# Patient Record
Sex: Female | Born: 1985 | Hispanic: Yes | Marital: Single | Smoking: Never smoker
Health system: Southern US, Community
[De-identification: ages and names within clinical notes are randomized; demographics above are authoritative.]

## PROBLEM LIST (undated history)

## (undated) DIAGNOSIS — R87629 Unspecified abnormal cytological findings in specimens from vagina: Secondary | ICD-10-CM

## (undated) DIAGNOSIS — Z8669 Personal history of other diseases of the nervous system and sense organs: Secondary | ICD-10-CM

## (undated) DIAGNOSIS — J45909 Unspecified asthma, uncomplicated: Secondary | ICD-10-CM

## (undated) DIAGNOSIS — F99 Mental disorder, not otherwise specified: Secondary | ICD-10-CM

## (undated) HISTORY — DX: Unspecified asthma, uncomplicated: J45.909

## (undated) HISTORY — PX: OTHER SURGICAL HISTORY: SHX169

## (undated) HISTORY — DX: Mental disorder, not otherwise specified: F99

## (undated) HISTORY — DX: Unspecified abnormal cytological findings in specimens from vagina: R87.629

---
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
## 2012-10-11 DIAGNOSIS — N871 Moderate cervical dysplasia: Secondary | ICD-10-CM | POA: Insufficient documentation

## 2012-10-17 ENCOUNTER — Emergency Department: Payer: Self-pay | Admitting: Emergency Medicine

## 2013-12-17 ENCOUNTER — Emergency Department: Payer: Self-pay | Admitting: Internal Medicine

## 2017-03-20 ENCOUNTER — Other Ambulatory Visit: Payer: Self-pay

## 2017-03-20 ENCOUNTER — Emergency Department
Admission: EM | Admit: 2017-03-20 | Discharge: 2017-03-21 | Disposition: A | Discharge status: Home / Self Care | Payer: Medicare Other | Attending: Emergency Medicine | Admitting: Emergency Medicine

## 2017-03-20 ENCOUNTER — Encounter: Payer: Self-pay | Admitting: *Deleted

## 2017-03-20 DIAGNOSIS — J039 Acute tonsillitis, unspecified: Secondary | ICD-10-CM | POA: Diagnosis not present

## 2017-03-20 DIAGNOSIS — J029 Acute pharyngitis, unspecified: Secondary | ICD-10-CM

## 2017-03-20 LAB — CBC WITH DIFFERENTIAL/PLATELET
BASOS ABS: 0 10*3/uL (ref 0–0.1)
BASOS PCT: 0 %
EOS ABS: 0.2 10*3/uL (ref 0–0.7)
EOS PCT: 2 %
HEMATOCRIT: 38.8 % (ref 35.0–47.0)
Hemoglobin: 13.1 g/dL (ref 12.0–16.0)
Lymphocytes Relative: 27 %
Lymphs Abs: 2.3 10*3/uL (ref 1.0–3.6)
MCH: 29.6 pg (ref 26.0–34.0)
MCHC: 33.7 g/dL (ref 32.0–36.0)
MCV: 87.9 fL (ref 80.0–100.0)
MONO ABS: 0.6 10*3/uL (ref 0.2–0.9)
Monocytes Relative: 7 %
Neutro Abs: 5.3 10*3/uL (ref 1.4–6.5)
Neutrophils Relative %: 64 %
PLATELETS: 369 10*3/uL (ref 150–440)
RBC: 4.42 MIL/uL (ref 3.80–5.20)
RDW: 12.9 % (ref 11.5–14.5)
WBC: 8.4 10*3/uL (ref 3.6–11.0)

## 2017-03-20 MED ORDER — DEXTROSE 5 % IV SOLN
1.0000 g | Freq: Once | INTRAVENOUS | Status: AC
  Administered 2017-03-20: 1 g via INTRAVENOUS
  Filled 2017-03-20: qty 10

## 2017-03-20 MED ORDER — DEXAMETHASONE SODIUM PHOSPHATE 10 MG/ML IJ SOLN
10.0000 mg | Freq: Once | INTRAMUSCULAR | Status: AC
  Administered 2017-03-20: 10 mg via INTRAVENOUS
  Filled 2017-03-20: qty 1

## 2017-03-20 NOTE — ED Triage Notes (Signed)
Pt reports three days of throat pain and feeling "like there is a mass in the back of my throat" Pt denies SOB or difficulty breathing but pt has not been able to swallow food without increased pain. Pts tonsils are notably swollen and per PCP left tonsil looked different than right. This RN does not notice a big difference between the two.   No fevers reported.

## 2017-03-20 NOTE — ED Notes (Signed)
Pt also requesting a pregnancy test. Pt did not have a normal period last month and is unsure if she is currently pregnant.

## 2017-03-21 ENCOUNTER — Encounter: Payer: Self-pay | Admitting: Radiology

## 2017-03-21 ENCOUNTER — Emergency Department: Payer: Medicare Other

## 2017-03-21 DIAGNOSIS — J029 Acute pharyngitis, unspecified: Secondary | ICD-10-CM | POA: Diagnosis not present

## 2017-03-21 LAB — COMPREHENSIVE METABOLIC PANEL
ALBUMIN: 4 g/dL (ref 3.5–5.0)
ALT: 16 U/L (ref 14–54)
ANION GAP: 12 (ref 5–15)
AST: 24 U/L (ref 15–41)
Alkaline Phosphatase: 88 U/L (ref 38–126)
BUN: 11 mg/dL (ref 6–20)
CHLORIDE: 102 mmol/L (ref 101–111)
CO2: 23 mmol/L (ref 22–32)
Calcium: 9.3 mg/dL (ref 8.9–10.3)
Creatinine, Ser: 0.76 mg/dL (ref 0.44–1.00)
GFR calc Af Amer: >60 mL/min (ref >60)
GFR calc non Af Amer: >60 mL/min (ref >60)
GLUCOSE: 95 mg/dL (ref 65–99)
Potassium: 3.5 mmol/L (ref 3.5–5.1)
SODIUM: 137 mmol/L (ref 135–145)
Total Bilirubin: 0.4 mg/dL (ref 0.3–1.2)
Total Protein: 8.1 g/dL (ref 6.5–8.1)

## 2017-03-21 LAB — POCT PREGNANCY, URINE: PREG TEST UR: NEGATIVE

## 2017-03-21 MED ORDER — PREDNISONE 20 MG PO TABS: 60.0000 mg | ORAL_TABLET | Freq: Every day | ORAL | 0 refills | Status: AC

## 2017-03-21 MED ORDER — AMOXICILLIN-POT CLAVULANATE 875-125 MG PO TABS: 1.0000 | ORAL_TABLET | Freq: Two times a day (BID) | ORAL | 0 refills | Status: AC

## 2017-03-21 MED ORDER — IOPAMIDOL (ISOVUE-300) INJECTION 61%
75.0000 mL | Freq: Once | INTRAVENOUS | Status: AC | PRN
  Administered 2017-03-21: 75 mL via INTRAVENOUS

## 2017-03-21 NOTE — ED Provider Notes (Signed)
[facility] Emergency Department Provider Note   First MD Initiated Contact with Patient 03/20/17 2310     (approximate)  I have reviewed the triage vital signs and the nursing notes.   HISTORY  Chief Complaint Sore Throat    HPI Jacqueline Osborn is a 32 y.o. female presents to the emergency department with 3-day history of sore throat and sensation of a mass in the back of her throat".  Patient states that she gets tonsillitis quite frequently sometimes twice or more per year.  Patient states that she was seen by her primary care provider who stated that her tonsils were swollen however no treatment was administered.  Patient denies any fever afebrile on presentation   Past medical history Tonsillitis There are no active problems to display for this patient.   Past surgical history None  Prior to Admission medications   Medication Sig Start Date End Date Taking? Authorizing Provider  amoxicillin-clavulanate (AUGMENTIN) 875-125 MG tablet Take 1 tablet by mouth 2 (two) times daily for 10 days. 03/21/17 03/31/17  Darci CurrentBrown, [place] N, MD  predniSONE (DELTASONE) 20 MG tablet Take 3 tablets (60 mg total) by mouth daily for 5 days. 03/21/17 03/26/17  Darci CurrentBrown, [place] N, MD    Allergies No known drug allergies History reviewed. No pertinent family history.  Social History Social History   Tobacco Use  . Smoking status: Never Smoker  . Smokeless tobacco: Never Used  Substance Use Topics  . Alcohol use: No    Frequency: Never  . Drug use: No    Review of Systems Constitutional: No fever/chills Eyes: No visual changes. ENT: Positive for sore throat. Cardiovascular: Denies chest pain. Respiratory: Denies shortness of breath. Gastrointestinal: No abdominal pain.  No nausea, no vomiting.  No diarrhea.  No constipation. Genitourinary: Negative for dysuria. Musculoskeletal: Negative for neck pain.  Negative for back pain. Integumentary: Negative for  rash. Neurological: Negative for headaches, focal weakness or numbness.   ____________________________________________   PHYSICAL EXAM:  VITAL SIGNS: ED Triage Vitals [03/20/17 2208]  Enc Vitals Group     BP (!) 149/88     Pulse Rate 91     Resp 16     Temp (!) 97.4 F (36.3 C)     Temp Source Oral     SpO2 98 %     Weight 116.4 kg (256 lb 9.9 oz)     Height      Head Circumference      Peak Flow      Pain Score 10     Pain Loc      Pain Edu?      Excl. in GC?     Constitutional: Alert and oriented. Well appearing and in no acute distress. Eyes: Conjunctivae are normal.  Head: Atraumatic. Ears:  Healthy appearing ear canals and TMs bilaterally Nose: No congestion/rhinnorhea. Mouth/Throat: Mucous membranes are moist.  Gradual erythema with enlarged tonsils right greater than left  neck: No stridor.  Palpable anterior cervical lymphadenopathy cardiovascular: Normal rate, regular rhythm. Good peripheral circulation. Grossly normal heart sounds. Respiratory: Normal respiratory effort.  No retractions. Lungs CTAB. Gastrointestinal: Soft and nontender. No distention.  Musculoskeletal: No lower extremity tenderness nor edema. No gross deformities of extremities. Neurologic:  Normal speech and language. No gross focal neurologic deficits are appreciated.  Skin:  Skin is warm, dry and intact. No rash noted. Psychiatric: Mood and affect are normal. Speech and behavior are normal.  ____________________________________________   LABS (all labs ordered are  listed, but only abnormal results are displayed)  Labs Reviewed  CBC WITH DIFFERENTIAL/PLATELET  COMPREHENSIVE METABOLIC PANEL  POC URINE PREG, ED  POCT PREGNANCY, URINE     Procedures   ____________________________________________   INITIAL IMPRESSION / ASSESSMENT AND PLAN / ED COURSE  As part of my medical decision making, I reviewed the following data within the electronic MEDICAL RECORD [id number] year old female  presenting with above-stated history and physical exam with suspicion for pharyngitis versus tonsillitis versus tonsillar abscess.  CT scan of the neck revealed pharyngitis/tonsillitis.  Patient given IV ceftriaxone 1 g and Solu-Medrol on 25 mg in the emergency department.  Patient will be prescribed Augmentin and prednisone for home with referral to ENT ____________________________________________  FINAL CLINICAL IMPRESSION(S) / ED DIAGNOSES  Final diagnoses:  Acute pharyngitis, unspecified etiology  Tonsillitis     MEDICATIONS GIVEN DURING THIS VISIT:  Medications  dexamethasone (DECADRON) injection 10 mg (10 mg Intravenous Given 03/20/17 2340)  cefTRIAXone (ROCEPHIN) 1 g in dextrose 5 % 50 mL IVPB (0 g Intravenous Stopped 03/21/17 0020)  iopamidol (ISOVUE-300) 61 % injection 75 mL (75 mLs Intravenous Contrast Given 03/21/17 0025)     ED Discharge Orders        Ordered    amoxicillin-clavulanate (AUGMENTIN) 875-125 MG tablet  2 times daily     03/21/17 0102    predniSONE (DELTASONE) 20 MG tablet  Daily     03/21/17 0102       Note:  This document was prepared using Dragon voice recognition software and may include unintentional dictation errors.    Darci Current, MD 03/21/17 [id number]

## 2018-02-01 DIAGNOSIS — Z8669 Personal history of other diseases of the nervous system and sense organs: Secondary | ICD-10-CM

## 2018-02-01 HISTORY — DX: Personal history of other diseases of the nervous system and sense organs: Z86.69

## 2018-02-21 ENCOUNTER — Encounter: Payer: Self-pay | Admitting: Family Medicine

## 2018-02-21 DIAGNOSIS — T7491XA Unspecified adult maltreatment, confirmed, initial encounter: Secondary | ICD-10-CM | POA: Insufficient documentation

## 2018-07-31 DIAGNOSIS — J45909 Unspecified asthma, uncomplicated: Secondary | ICD-10-CM

## 2018-07-31 DIAGNOSIS — Z915 Personal history of self-harm: Secondary | ICD-10-CM | POA: Insufficient documentation

## 2018-07-31 DIAGNOSIS — O0990 Supervision of high risk pregnancy, unspecified, unspecified trimester: Secondary | ICD-10-CM | POA: Insufficient documentation

## 2018-07-31 DIAGNOSIS — O99210 Obesity complicating pregnancy, unspecified trimester: Secondary | ICD-10-CM | POA: Insufficient documentation

## 2018-07-31 DIAGNOSIS — Z9151 Personal history of suicidal behavior: Secondary | ICD-10-CM | POA: Insufficient documentation

## 2018-07-31 DIAGNOSIS — I10 Essential (primary) hypertension: Secondary | ICD-10-CM | POA: Insufficient documentation

## 2018-07-31 DIAGNOSIS — F1210 Cannabis abuse, uncomplicated: Secondary | ICD-10-CM | POA: Insufficient documentation

## 2018-07-31 DIAGNOSIS — O99810 Abnormal glucose complicating pregnancy: Secondary | ICD-10-CM

## 2018-07-31 DIAGNOSIS — Z6740 Type O blood, Rh positive: Secondary | ICD-10-CM

## 2018-07-31 DIAGNOSIS — F331 Major depressive disorder, recurrent, moderate: Secondary | ICD-10-CM | POA: Insufficient documentation

## 2018-08-07 ENCOUNTER — Other Ambulatory Visit: Payer: Self-pay

## 2018-08-07 ENCOUNTER — Ambulatory Visit: Payer: Medicare Other | Admitting: Physician Assistant

## 2018-08-07 VITALS — BP 122/77 | Temp 98.1°F | Wt 285.4 lb

## 2018-08-07 DIAGNOSIS — O163 Unspecified maternal hypertension, third trimester: Secondary | ICD-10-CM

## 2018-08-07 DIAGNOSIS — O0993 Supervision of high risk pregnancy, unspecified, third trimester: Secondary | ICD-10-CM | POA: Diagnosis not present

## 2018-08-07 DIAGNOSIS — Z9151 Personal history of suicidal behavior: Secondary | ICD-10-CM

## 2018-08-07 DIAGNOSIS — O99213 Obesity complicating pregnancy, third trimester: Secondary | ICD-10-CM | POA: Diagnosis not present

## 2018-08-07 DIAGNOSIS — F1210 Cannabis abuse, uncomplicated: Secondary | ICD-10-CM

## 2018-08-07 DIAGNOSIS — I10 Essential (primary) hypertension: Secondary | ICD-10-CM

## 2018-08-07 DIAGNOSIS — Z6740 Type O blood, Rh positive: Secondary | ICD-10-CM

## 2018-08-07 DIAGNOSIS — T7491XD Unspecified adult maltreatment, confirmed, subsequent encounter: Secondary | ICD-10-CM

## 2018-08-07 DIAGNOSIS — Z915 Personal history of self-harm: Secondary | ICD-10-CM

## 2018-08-07 DIAGNOSIS — F331 Major depressive disorder, recurrent, moderate: Secondary | ICD-10-CM

## 2018-08-07 DIAGNOSIS — N871 Moderate cervical dysplasia: Secondary | ICD-10-CM

## 2018-08-07 NOTE — Progress Notes (Signed)
In for visit; aware of [facility] IOL 08/10/18 and NST today.

## 2018-08-10 DIAGNOSIS — O0990 Supervision of high risk pregnancy, unspecified, unspecified trimester: Secondary | ICD-10-CM | POA: Diagnosis not present

## 2018-08-10 DIAGNOSIS — O169 Unspecified maternal hypertension, unspecified trimester: Secondary | ICD-10-CM | POA: Insufficient documentation
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
## 2018-08-14 ENCOUNTER — Telehealth: Payer: Self-pay | Admitting: Licensed Clinical Social Worker

## 2018-08-14 NOTE — Telephone Encounter (Signed)
LCSW returned call and spoke with Mr. Jacqueline Osborn. LCSW confirmed that Mr. Caceres need the information for the purposes of patient's treatment. He voiced that patient saw the doctor today and wanted to confirm her mental health treatment. LCSW informed Mr. Caceres that LCSW was not currently providing services to this patient.

## 2018-08-14 NOTE — Telephone Encounter (Signed)
Mr. Vanetta Shawl, Behavior Health Clinician from [facility], left vm for LCSW requesting confirmation of services being provided to patient by LCSW for the purposes of treatment.

## 2018-08-17 ENCOUNTER — Telehealth: Payer: Self-pay | Admitting: Licensed Clinical Social Worker

## 2018-08-17 NOTE — Telephone Encounter (Signed)
Called to conduct 2 week postpartum mood check. LCSW spoke with patient and provided brief psycoeduction on postpartum mood and anxiety disorders. LCSW assessed patient's mood and anxiety symptoms. Patient denies any current symptoms, she reports compliance with meds and reports benefit. Patient shares that her mom is helping her with baby so she can get some sleep, and she reports everything is good. Patient reports that she followed up with [name] regarding her blood pressure and plans to come to [facility] for postpartum exam. LCSW encouraged patient to seek support if needed.

## 2018-08-20 ENCOUNTER — Encounter: Payer: Self-pay | Admitting: Family Medicine

## 2018-08-22 ENCOUNTER — Ambulatory Visit: Payer: Medicare Other

## 2018-09-22 ENCOUNTER — Ambulatory Visit: Payer: Medicare Other

## 2018-09-26 ENCOUNTER — Telehealth: Payer: Self-pay | Admitting: Family Medicine

## 2018-11-02 ENCOUNTER — Ambulatory Visit: Payer: Self-pay

## 2018-11-02 ENCOUNTER — Ambulatory Visit: Payer: Medicare Other | Admitting: Advanced Practice Midwife

## 2018-11-02 ENCOUNTER — Other Ambulatory Visit: Payer: Self-pay

## 2018-11-02 VITALS — BP 126/86 | Wt 254.2 lb

## 2018-11-02 DIAGNOSIS — Z3009 Encounter for other general counseling and advice on contraception: Secondary | ICD-10-CM

## 2018-11-02 LAB — HEMOGLOBIN, FINGERSTICK: Hemoglobin: 12.5 g/dL (ref 11.1–15.9)

## 2018-11-02 NOTE — Progress Notes (Signed)
Post Partum Exam  Jacqueline Osborn is a 33 y.o. G36P3003 female who presents for a postpartum visit. She is 12 weeks postpartum following a spontaneous vaginal delivery. I have fully reviewed the prenatal and intrapartum course. The delivery was at 24 1/7 gestational weeks, F 9# at 49 1/7 on 08/11/18.  Anesthesia: nitrous oxide. Postpartum course has been wnl except walked out AMA after birth declining BP monitoring after initiation of Procardia XL 30 mg daily; BP's immediately pp were 144/105.  States she went to [name] on 08/14/18 and they increased her dose to BID and she never went to f/u appt there and ran out in July.. Baby's course has been wnl. Baby is feeding by bottle - formula q 3-4 hrs (4 oz) and weighed 11# on 10/12/18.Marland Kitchen Bleeding no bleeding. Bowel function is normal. Bladder function is normal. Patient is sexually active. Contraception method is none. Pt states declines Zoloft that took during this pregnancy and states mood is "fine".  Last sex yesterday without condom, time before was mid 09/2018 without condom.  Denies partner abuse.  Lives with FOB and 3 children.  Her 59 yo daughter helps her.  Postpartum depression screening: Edinburgh Postnatal Depression Scale - 11/02/18 1426      Edinburgh Postnatal Depression Scale:  In the Past 7 Days   I have been able to laugh and see the funny side of things.  0    I have looked forward with enjoyment to things.  0    I have blamed myself unnecessarily when things went wrong.  1    I have been anxious or worried for no good reason.  0    I have felt scared or panicky for no good reason.  0    Things have been getting on top of me.  0    I have been so unhappy that I have had difficulty sleeping.  1    I have felt sad or miserable.  0    I have been so unhappy that I have been crying.  0    The thought of harming myself has occurred to me.  0    Edinburgh Postnatal Depression Scale Total  2         Last pap smear done 02/2018? and was  Abnormal- neg with +HPV  Review of Systems Pertinent items are noted in HPI.    Objective:  BP 126/86   Wt 254 lb 3.2 oz (115.3 kg)   LMP 10/19/2017 (Exact Date) Comment: normal  Breastfeeding No   BMI 42.30 kg/m   General:  alert   Breasts:  negative  Lungs: clear to auscultation bilaterally  Heart:  regular rate and rhythm, S1, S2 normal, no murmur, click, rub or gallop  Abdomen: soft, non-tender; bowel sounds normal; no masses,  no organomegaly   Vulva:  normal  Vagina: normal vagina  Cervix:  multiparous appearance  Corpus: normal size, contour, position, consistency, mobility, non-tender  Adnexa:  normal adnexa  Rectal Exam: Normal rectovaginal exam        Assessment:    12 wk postpartum exam. Pap smear needed 02/2019  Plan:   1. Contraception: abstinence 2. Infant feeding:  patient is currently feeding with formula.  If breastmilk feeding patient was given letter for employer to provide appropriate pumping time to express breastmilk.  3. Mood: EPDS is low risk. Reviewed resources and that mood sx in first year after pregnancy are considered related to pregnancy and to reach out for help  at [facility] if needed. Discussed [facility] as link to care and availability of LCSW for counseling  4. Chronic Medical Conditions:  HTN list chronic medical problems and follow up/management plan.   Patient given handout about PCP care in the community Given MVI per family planning program  Follow up in: 2 weeks for Nexplanon insertion.  Needs PT before insertion and pt declines birth control and wants abstinance from now until then

## 2018-11-06 NOTE — Progress Notes (Addendum)
Hgb reviewed, no tx per standing order. Nexplanon insertion scheduled for 11/16/2018.  Abstinence consent signed. Provider orders completed.

## 2018-11-16 ENCOUNTER — Ambulatory Visit: Payer: Self-pay

## 2018-12-27 ENCOUNTER — Encounter: Payer: Self-pay | Admitting: Family Medicine

## 2018-12-27 ENCOUNTER — Other Ambulatory Visit: Payer: Self-pay

## 2018-12-27 ENCOUNTER — Ambulatory Visit (LOCAL_COMMUNITY_HEALTH_CENTER): Payer: Medicare Other | Admitting: Family Medicine

## 2018-12-27 VITALS — BP 125/87 | Ht 66.0 in | Wt 250.0 lb

## 2018-12-27 DIAGNOSIS — Z30017 Encounter for initial prescription of implantable subdermal contraceptive: Secondary | ICD-10-CM

## 2018-12-27 DIAGNOSIS — Z3009 Encounter for other general counseling and advice on contraception: Secondary | ICD-10-CM | POA: Diagnosis not present

## 2018-12-27 MED ORDER — ETONOGESTREL 68 MG ~~LOC~~ IMPL
68.0000 mg | DRUG_IMPLANT | Freq: Once | SUBCUTANEOUS | Status: AC
  Administered 2018-12-27: 16:00:00 68 mg via SUBCUTANEOUS

## 2018-12-27 NOTE — Progress Notes (Addendum)
Pt here for Nexplanon insertion. Pt with recent abortion on 12/12/2018 and reports last sex was ~11/16/2018. RN counseling for Nexplanon insertion completed and consent forms reviewed and signed by pt.Ronny Bacon, RN

## 2018-12-27 NOTE — Progress Notes (Signed)
Nexplanon Insertion Procedure Patient identified, informed consent performed, consent signed.   Patient does understand that irregular bleeding is a very common side effect of this medication. She was advised to have backup contraception after placement. Patient was determined to meet WHO criteria for not being pregnant. Appropriate time out taken.  The insertion site was identified 8-10 cm (3-4 inches) from the medial epicondyle of the humerus and 3-5 cm (1.25-2 inches) posterior to (below) the sulcus (groove) between the biceps and triceps muscles of the patient's left arm and marked.  Patient was prepped with alcohol swab and then injected with 3 ml of 1% lidocaine.  Arm was prepped with chlorhexidene, Nexplanon removed from packaging,  Device confirmed in needle, then inserted full length of needle and withdrawn per handbook instructions. Nexplanon was able to palpated in the patient's arm; patient palpated the insert herself. There was minimal blood loss.  Patient insertion site covered with guaze and a pressure bandage to reduce any bruising.  The patient tolerated the procedure well and was given post procedure instructions.  Nexplanon:   Counseled patient to take OTC analgesic starting as soon as lidocaine starts to wear off and take regularly for at least 48 hr to decrease discomfort.  Specifically to take with food or milk to decrease stomach upset and for IB 600 mg (3 tablets) every 6 hrs; IB 800 mg (4 tablets) every 8 hrs; or Aleve 2 tablets every 12 hrs.   

## 2019-02-28 ENCOUNTER — Telehealth: Payer: Self-pay | Admitting: Family Medicine

## 2019-02-28 NOTE — Telephone Encounter (Signed)
TC to patient who states she had nexplanon inserted 10/2018 at [facility] appointment. She states she had normal period last month and long period this month. Patient states her period this month has been almost 2 weeks, last few days spotting. Patient counseled that irregular periods are common for 3-6 months following nexplanon insertion. Patient states she was told to call with any issues and wanted to let us know about longer than normal period. She declines need for provider consult at this time, and was counseled that if her period continues and gets very heavy again this month to call back for phone consult. Patient agrees with plan.Burt Knack, RN

## 2019-02-28 NOTE — Telephone Encounter (Signed)
Patient would like to speak to nurse about her nexplanon

## 2019-05-01 ENCOUNTER — Ambulatory Visit: Payer: Self-pay

## 2019-05-08 ENCOUNTER — Ambulatory Visit: Payer: Medicare Other

## 2019-05-29 NOTE — Telephone Encounter (Signed)
Appt scheduled

## 2019-11-12 ENCOUNTER — Ambulatory Visit: Payer: Self-pay | Admitting: Physician Assistant

## 2019-11-12 ENCOUNTER — Other Ambulatory Visit: Payer: Self-pay

## 2019-11-12 DIAGNOSIS — Z113 Encounter for screening for infections with a predominantly sexual mode of transmission: Secondary | ICD-10-CM

## 2019-11-12 LAB — WET PREP FOR TRICH, YEAST, CLUE
Trichomonas Exam: NEGATIVE
Yeast Exam: NEGATIVE

## 2019-11-13 ENCOUNTER — Encounter: Payer: Self-pay | Admitting: Physician Assistant
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
## 2019-11-17 LAB — GONOCOCCUS CULTURE

## 2019-11-20 ENCOUNTER — Telehealth: Payer: Self-pay | Admitting: Family Medicine

## 2019-11-20 NOTE — Telephone Encounter (Signed)
Call to patient to discuss positive TR.  Patient verified by password.  Patient informed of + CT result.  Patient schedueld for treatment appointment.  Patient verbalizes understanding at this time and has no further questions.  Wendi Snipes, RN

## 2019-11-21 ENCOUNTER — Other Ambulatory Visit: Payer: Self-pay

## 2019-11-21 ENCOUNTER — Ambulatory Visit: Payer: Self-pay

## 2019-11-21 DIAGNOSIS — A749 Chlamydial infection, unspecified: Secondary | ICD-10-CM

## 2019-11-21 MED ORDER — AZITHROMYCIN 500 MG PO TABS
1000.0000 mg | ORAL_TABLET | Freq: Once | ORAL | Status: AC
  Administered 2019-11-21: 1000 mg via ORAL

## 2019-11-21 NOTE — Progress Notes (Signed)
Treated for Chlamydia today with Azithromycin per Standing Order Dr. Alvester Morin. Reports eating before appt. Instructions given to call for appt for re treatment if vomits within 2 hrs. Pt's questions answered and states understanding. Jerel Shepherd, RN

## 2020-04-02 ENCOUNTER — Ambulatory Visit (LOCAL_COMMUNITY_HEALTH_CENTER): Payer: Medicare Other | Admitting: Physician Assistant

## 2020-04-02 ENCOUNTER — Other Ambulatory Visit: Payer: Self-pay

## 2020-04-02 ENCOUNTER — Encounter: Payer: Self-pay | Admitting: Physician Assistant

## 2020-04-02 VITALS — BP 142/93 | Ht 65.0 in | Wt 286.8 lb

## 2020-04-02 DIAGNOSIS — B3731 Acute candidiasis of vulva and vagina: Secondary | ICD-10-CM

## 2020-04-02 DIAGNOSIS — Z01419 Encounter for gynecological examination (general) (routine) without abnormal findings: Secondary | ICD-10-CM | POA: Diagnosis not present

## 2020-04-02 DIAGNOSIS — Z113 Encounter for screening for infections with a predominantly sexual mode of transmission: Secondary | ICD-10-CM

## 2020-04-02 DIAGNOSIS — B373 Candidiasis of vulva and vagina: Secondary | ICD-10-CM

## 2020-04-02 DIAGNOSIS — Z3009 Encounter for other general counseling and advice on contraception: Secondary | ICD-10-CM | POA: Diagnosis not present

## 2020-04-02 DIAGNOSIS — Z3046 Encounter for surveillance of implantable subdermal contraceptive: Secondary | ICD-10-CM | POA: Diagnosis not present

## 2020-04-02 MED ORDER — CLOTRIMAZOLE 1 % VA CREA: 1.0000 | TOPICAL_CREAM | Freq: Every day | VAGINAL | 0 refills | Status: AC

## 2020-04-02 NOTE — Progress Notes (Unsigned)
Pt to clinic for physical and STD screening. Pt is happy with Nexplanon, placed by [facility] 12/27/2018. Pt denies headache, blurred vision, dizziness and stated she knew it would be high after running after daughter (in the room).

## 2020-04-02 NOTE — Progress Notes (Signed)
TR pending. Condoms given. Dispensed vaginal yeast cream per provider verbal order. BP recheck of 142/93 shown to provider, pt was still trying to take care of child and talking while BP was being taken. Provider orders completed.

## 2020-04-04 ENCOUNTER — Encounter: Payer: Self-pay | Admitting: Physician Assistant
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
## 2020-04-07 LAB — IGP, APTIMA HPV
HPV Aptima: NEGATIVE
PAP Smear Comment: 0

## 2020-04-08 LAB — HM HIV SCREENING LAB: HM HIV Screening: NEGATIVE

## 2020-04-08 LAB — HM HEPATITIS C SCREENING LAB: HM Hepatitis Screen: NEGATIVE

## 2020-06-26 ENCOUNTER — Ambulatory Visit: Payer: Medicare Other

## 2020-07-14 ENCOUNTER — Other Ambulatory Visit: Payer: Self-pay

## 2020-07-14 ENCOUNTER — Ambulatory Visit (LOCAL_COMMUNITY_HEALTH_CENTER): Payer: Medicare Other | Admitting: Family Medicine

## 2020-07-14 VITALS — BP 133/98 | Ht 65.0 in | Wt 276.6 lb

## 2020-07-14 DIAGNOSIS — Z30011 Encounter for initial prescription of contraceptive pills: Secondary | ICD-10-CM | POA: Diagnosis not present

## 2020-07-14 DIAGNOSIS — Z3046 Encounter for surveillance of implantable subdermal contraceptive: Secondary | ICD-10-CM | POA: Diagnosis not present

## 2020-07-14 DIAGNOSIS — Z3009 Encounter for other general counseling and advice on contraception: Secondary | ICD-10-CM

## 2020-07-14 MED ORDER — NORGESTIM-ETH ESTRAD TRIPHASIC 0.18/0.215/0.25 MG-25 MCG PO TABS: 1.0000 | ORAL_TABLET | Freq: Every day | ORAL | 8 refills | Status: DC

## 2020-07-14 NOTE — Progress Notes (Signed)
   [facility]  Subjective:  Jacqueline Osborn is a 35 y.o. being seen today for   Chief Complaint  Patient presents with  . Contraception    Removal of Nexplanon    HPI   Does the patient have a current or past history of drug use? No   No components found for: HCV]   Health Maintenance Due  Topic Date Due  . COVID-19 Vaccine (1) Never done  . Pneumococcal Vaccine 41-65 Years old (1 of 2 - PPSV23) Never done    ROS  The following portions of the patient's history were reviewed and updated as appropriate: allergies, current medications, past family history, past medical history, past social history, past surgical history and problem list. Problem list updated.   See flowsheet for other program required questions.  Objective:   Vitals:   07/14/20 1521  BP: (!) 133/98  Weight: 276 lb 9.6 oz (125.5 kg)  Height: 5\' 5"  (1.651 m)    Physical Exam    Assessment and Plan:  Jacqueline Osborn is a 35 y.o. female presenting to the [facility] Department for a Women's Health problem visit  1. Nexplanon removal Patient identified, informed consent performed, consent signed.   Appropriate time out taken. Nexplanon site identified.  Area prepped in usual sterile fashon. 3 ml of 1% lidocaine with Epinephrine was used to anesthetize the area at the distal end of the implant and along implant site. A small stab incision was made right beside the implant on the distal portion.  The Nexplanon rod was grasped using hemostats and removed without difficulty.  There was minimal blood loss. There were no complications.  Steri-strips were applied over the small incision.  A pressure bandage was applied to reduce any bruising.  The patient tolerated the procedure well and was given post procedure instructions.    Counseled patient to take OTC analgesic starting as soon as lidocaine starts to wear off and take regularly  for at least 48 hr to decrease discomfort.  Specifically to take with food or milk to decrease stomach upset and for IB 600 mg (3 tablets) every 6 hrs; IB 800 mg (4 tablets) every 8 hrs; or Aleve 2 tablets every 12 hrs.   2. Family planning services Discussed with patient about BC options. Pt wants to use OCP for BCM.   3. Encounter for initial prescription of contraceptive pills  RX sent to listed pharmacy  - Norgestimate-Ethinyl Estradiol Triphasic 0.18/0.215/0.25 MG-25 MCG tab; Take 1 tablet by mouth daily.  Dispense: 28 tablet; Refill: 8         Return for as needed.  No future appointments.  03-08-1981, FNP

## 2020-08-05 ENCOUNTER — Other Ambulatory Visit: Payer: Self-pay

## 2020-08-05 ENCOUNTER — Encounter: Payer: Self-pay | Admitting: Advanced Practice Midwife

## 2020-08-05 ENCOUNTER — Ambulatory Visit: Payer: Medicare Other | Admitting: Advanced Practice Midwife

## 2020-08-05 DIAGNOSIS — N871 Moderate cervical dysplasia: Secondary | ICD-10-CM

## 2020-08-05 DIAGNOSIS — I10 Essential (primary) hypertension: Secondary | ICD-10-CM

## 2020-08-05 DIAGNOSIS — Z113 Encounter for screening for infections with a predominantly sexual mode of transmission: Secondary | ICD-10-CM

## 2020-08-05 DIAGNOSIS — F319 Bipolar disorder, unspecified: Secondary | ICD-10-CM | POA: Insufficient documentation

## 2020-08-05 LAB — WET PREP FOR TRICH, YEAST, CLUE
Trichomonas Exam: NEGATIVE
Yeast Exam: NEGATIVE
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
## 2020-08-05 NOTE — Progress Notes (Signed)
Wet mount reviewed, no tx per standing order. Provider orders completed. 

## 2020-08-07 LAB — HM HIV SCREENING LAB: HM HIV Screening: NEGATIVE

## 2020-08-10 LAB — GONOCOCCUS CULTURE

## 2020-09-08 ENCOUNTER — Emergency Department
Admission: EM | Admit: 2020-09-08 | Discharge: 2020-09-08 | Disposition: A | Discharge status: Home / Self Care | Payer: Medicare Other | Attending: Emergency Medicine | Admitting: Emergency Medicine

## 2020-09-08 ENCOUNTER — Other Ambulatory Visit: Payer: Self-pay

## 2020-09-08 ENCOUNTER — Emergency Department: Payer: Medicare Other

## 2020-09-08 DIAGNOSIS — J4541 Moderate persistent asthma with (acute) exacerbation: Secondary | ICD-10-CM | POA: Insufficient documentation

## 2020-09-08 DIAGNOSIS — I10 Essential (primary) hypertension: Secondary | ICD-10-CM | POA: Diagnosis not present

## 2020-09-08 DIAGNOSIS — Z20822 Contact with and (suspected) exposure to covid-19: Secondary | ICD-10-CM | POA: Diagnosis not present

## 2020-09-08 DIAGNOSIS — J189 Pneumonia, unspecified organism: Secondary | ICD-10-CM | POA: Insufficient documentation

## 2020-09-08 DIAGNOSIS — R062 Wheezing: Secondary | ICD-10-CM | POA: Diagnosis present

## 2020-09-08 LAB — POC URINE PREG, ED: Preg Test, Ur: NEGATIVE

## 2020-09-08 LAB — CBC WITH DIFFERENTIAL/PLATELET
Abs Immature Granulocytes: 0.02 10*3/uL (ref 0.00–0.07)
Basophils Absolute: 0 10*3/uL (ref 0.0–0.1)
Basophils Relative: 0 %
Eosinophils Absolute: 0.2 10*3/uL (ref 0.0–0.5)
Eosinophils Relative: 3 %
HCT: 37.1 % (ref 36.0–46.0)
Hemoglobin: 12.3 g/dL (ref 12.0–15.0)
Immature Granulocytes: 0 %
Lymphocytes Relative: 18 %
Lymphs Abs: 1 10*3/uL (ref 0.7–4.0)
MCH: 29.9 pg (ref 26.0–34.0)
MCHC: 33.2 g/dL (ref 30.0–36.0)
MCV: 90.3 fL (ref 80.0–100.0)
Monocytes Absolute: 0.4 10*3/uL (ref 0.1–1.0)
Monocytes Relative: 7 %
Neutro Abs: 3.9 10*3/uL (ref 1.7–7.7)
Neutrophils Relative %: 72 %
Platelets: 338 10*3/uL (ref 150–400)
RBC: 4.11 MIL/uL (ref 3.87–5.11)
RDW: 12.5 % (ref 11.5–15.5)
WBC: 5.5 10*3/uL (ref 4.0–10.5)
nRBC: 0 % (ref 0.0–0.2)

## 2020-09-08 LAB — URINALYSIS, COMPLETE (UACMP) WITH MICROSCOPIC
Bacteria, UA: NONE SEEN
Bilirubin Urine: NEGATIVE
Glucose, UA: NEGATIVE mg/dL
Ketones, ur: NEGATIVE mg/dL
Leukocytes,Ua: NEGATIVE
Nitrite: NEGATIVE
Protein, ur: NEGATIVE mg/dL
Specific Gravity, Urine: 1.018 (ref 1.005–1.030)
pH: 6 (ref 5.0–8.0)

## 2020-09-08 LAB — COMPREHENSIVE METABOLIC PANEL
ALT: 30 U/L (ref 0–44)
AST: 30 U/L (ref 15–41)
Albumin: 4 g/dL (ref 3.5–5.0)
Alkaline Phosphatase: 86 U/L (ref 38–126)
Anion gap: 6 (ref 5–15)
BUN: 13 mg/dL (ref 6–20)
CO2: 28 mmol/L (ref 22–32)
Calcium: 9 mg/dL (ref 8.9–10.3)
Chloride: 105 mmol/L (ref 98–111)
Creatinine, Ser: 0.82 mg/dL (ref 0.44–1.00)
GFR, Estimated: >60 mL/min (ref >60)
Glucose, Bld: 105 mg/dL — ABNORMAL HIGH (ref 70–99)
Potassium: 4 mmol/L (ref 3.5–5.1)
Sodium: 139 mmol/L (ref 135–145)
Total Bilirubin: 0.6 mg/dL (ref 0.3–1.2)
Total Protein: 7.3 g/dL (ref 6.5–8.1)

## 2020-09-08 LAB — RESP PANEL BY RT-PCR (FLU A&B, COVID) ARPGX2
Influenza A by PCR: NEGATIVE
Influenza B by PCR: NEGATIVE
SARS Coronavirus 2 by RT PCR: NEGATIVE

## 2020-09-08 LAB — LACTIC ACID, PLASMA: Lactic Acid, Venous: 1.1 mmol/L (ref 0.5–1.9)

## 2020-09-08 MED ORDER — IPRATROPIUM-ALBUTEROL 0.5-2.5 (3) MG/3ML IN SOLN
3.0000 mL | Freq: Once | RESPIRATORY_TRACT | Status: AC
  Administered 2020-09-08: 3 mL via RESPIRATORY_TRACT
  Filled 2020-09-08: qty 3

## 2020-09-08 MED ORDER — IPRATROPIUM-ALBUTEROL 0.5-2.5 (3) MG/3ML IN SOLN: 3.0000 mL | RESPIRATORY_TRACT | 1 refills | Status: DC | PRN

## 2020-09-08 MED ORDER — IBUPROFEN 800 MG PO TABS
800.0000 mg | ORAL_TABLET | Freq: Once | ORAL | Status: AC
  Administered 2020-09-08: 800 mg via ORAL
  Filled 2020-09-08: qty 1

## 2020-09-08 MED ORDER — SODIUM CHLORIDE 0.9 % IV BOLUS
1000.0000 mL | Freq: Once | INTRAVENOUS | Status: AC
  Administered 2020-09-08: 1000 mL via INTRAVENOUS

## 2020-09-08 MED ORDER — AZITHROMYCIN 500 MG PO TABS
500.0000 mg | ORAL_TABLET | Freq: Once | ORAL | Status: AC
  Administered 2020-09-08: 500 mg via ORAL
  Filled 2020-09-08: qty 1

## 2020-09-08 MED ORDER — ALBUTEROL SULFATE HFA 108 (90 BASE) MCG/ACT IN AERS: 2.0000 | INHALATION_SPRAY | Freq: Four times a day (QID) | RESPIRATORY_TRACT | 0 refills | Status: AC | PRN

## 2020-09-08 MED ORDER — CEPHALEXIN 500 MG PO CAPS: 500.0000 mg | ORAL_CAPSULE | Freq: Three times a day (TID) | ORAL | 0 refills | Status: AC

## 2020-09-08 MED ORDER — CEPHALEXIN 500 MG PO CAPS
500.0000 mg | ORAL_CAPSULE | Freq: Once | ORAL | Status: AC
  Administered 2020-09-08: 500 mg via ORAL
  Filled 2020-09-08: qty 1

## 2020-09-08 MED ORDER — ACETAMINOPHEN 325 MG PO TABS
650.0000 mg | ORAL_TABLET | Freq: Once | ORAL | Status: AC | PRN
  Administered 2020-09-08: 650 mg via ORAL
  Filled 2020-09-08: qty 2

## 2020-09-08 MED ORDER — AZITHROMYCIN 250 MG PO TABS: 250.0000 mg | ORAL_TABLET | Freq: Every day | ORAL | 0 refills | Status: AC

## 2020-09-08 NOTE — Discharge Instructions (Addendum)
You are being treated for suspected pneumonia.  Take antibiotics as prescribed, and use the bronchodilators as needed. Continue to hydrate to prevent dehydration.  Follow-up with primary provider return to the ED if needed.

## 2020-09-08 NOTE — ED Triage Notes (Signed)
Patient presents to ER from home. Patient reports asthma flare up for 3 days. Patient reports wheezing, coughing and fatigue. Patient reports she is out of all medications. Patient A&OX3.

## 2020-09-08 NOTE — ED Notes (Signed)
See triage note  Presents with low grade fever,body aches and cough  sxs' started about 3 days ago  Has been out of inhalers for her asthma
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
## 2020-09-13 LAB — CULTURE, BLOOD (ROUTINE X 2)
Culture: NO GROWTH
Culture: NO GROWTH
Special Requests: ADEQUATE
Special Requests: ADEQUATE

## 2020-12-24 ENCOUNTER — Encounter: Payer: Self-pay | Admitting: Physician Assistant

## 2020-12-24 ENCOUNTER — Other Ambulatory Visit: Payer: Self-pay

## 2020-12-24 ENCOUNTER — Ambulatory Visit: Payer: Self-pay | Admitting: Physician Assistant

## 2020-12-24 DIAGNOSIS — Z113 Encounter for screening for infections with a predominantly sexual mode of transmission: Secondary | ICD-10-CM

## 2020-12-24 LAB — WET PREP FOR TRICH, YEAST, CLUE
Trichomonas Exam: NEGATIVE
Yeast Exam: NEGATIVE
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
## 2021-01-19 ENCOUNTER — Telehealth: Payer: Self-pay | Admitting: Family Medicine

## 2021-01-19 NOTE — Telephone Encounter (Signed)
Pt calling about test results  °

## 2021-05-18 ENCOUNTER — Ambulatory Visit: Payer: Medicare HMO

## 2021-05-21 ENCOUNTER — Ambulatory Visit: Payer: Medicare HMO

## 2021-08-04 ENCOUNTER — Emergency Department
Admission: EM | Admit: 2021-08-04 | Discharge: 2021-08-04 | Disposition: A | Discharge status: Home / Self Care | Payer: Medicare HMO | Attending: Emergency Medicine | Admitting: Emergency Medicine

## 2021-08-04 ENCOUNTER — Other Ambulatory Visit: Payer: Self-pay

## 2021-08-04 ENCOUNTER — Encounter: Payer: Self-pay | Admitting: Emergency Medicine

## 2021-08-04 DIAGNOSIS — R109 Unspecified abdominal pain: Secondary | ICD-10-CM | POA: Diagnosis not present

## 2021-08-04 DIAGNOSIS — E119 Type 2 diabetes mellitus without complications: Secondary | ICD-10-CM | POA: Diagnosis not present

## 2021-08-04 DIAGNOSIS — R112 Nausea with vomiting, unspecified: Secondary | ICD-10-CM | POA: Diagnosis present

## 2021-08-04 DIAGNOSIS — T887XXA Unspecified adverse effect of drug or medicament, initial encounter: Secondary | ICD-10-CM | POA: Diagnosis not present

## 2021-08-04 DIAGNOSIS — T50901A Poisoning by unspecified drugs, medicaments and biological substances, accidental (unintentional), initial encounter: Secondary | ICD-10-CM | POA: Diagnosis not present

## 2021-08-04 DIAGNOSIS — T50905A Adverse effect of unspecified drugs, medicaments and biological substances, initial encounter: Secondary | ICD-10-CM

## 2021-08-04 LAB — COMPREHENSIVE METABOLIC PANEL
ALT: 24 U/L (ref 0–44)
AST: 22 U/L (ref 15–41)
Albumin: 5 g/dL (ref 3.5–5.0)
Alkaline Phosphatase: 95 U/L (ref 38–126)
Anion gap: 13 (ref 5–15)
BUN: 13 mg/dL (ref 6–20)
CO2: 23 mmol/L (ref 22–32)
Calcium: 9.8 mg/dL (ref 8.9–10.3)
Chloride: 105 mmol/L (ref 98–111)
Creatinine, Ser: 0.83 mg/dL (ref 0.44–1.00)
GFR, Estimated: >60 mL/min (ref >60)
Glucose, Bld: 93 mg/dL (ref 70–99)
Potassium: 3.6 mmol/L (ref 3.5–5.1)
Sodium: 141 mmol/L (ref 135–145)
Total Bilirubin: 0.7 mg/dL (ref 0.3–1.2)
Total Protein: 9.7 g/dL — ABNORMAL HIGH (ref 6.5–8.1)

## 2021-08-04 LAB — URINALYSIS, ROUTINE W REFLEX MICROSCOPIC
Bilirubin Urine: NEGATIVE
Glucose, UA: NEGATIVE mg/dL
Ketones, ur: 5 mg/dL — AB
Leukocytes,Ua: NEGATIVE
Nitrite: NEGATIVE
Protein, ur: 30 mg/dL — AB
Specific Gravity, Urine: 1.023 (ref 1.005–1.030)
pH: 6 (ref 5.0–8.0)

## 2021-08-04 LAB — CBC
HCT: 43.9 % (ref 36.0–46.0)
Hemoglobin: 14.4 g/dL (ref 12.0–15.0)
MCH: 28.6 pg (ref 26.0–34.0)
MCHC: 32.8 g/dL (ref 30.0–36.0)
MCV: 87.1 fL (ref 80.0–100.0)
Platelets: 501 10*3/uL — ABNORMAL HIGH (ref 150–400)
RBC: 5.04 MIL/uL (ref 3.87–5.11)
RDW: 12.4 % (ref 11.5–15.5)
WBC: 9.6 10*3/uL (ref 4.0–10.5)
nRBC: 0 % (ref 0.0–0.2)

## 2021-08-04 LAB — POC URINE PREG, ED: Preg Test, Ur: NEGATIVE

## 2021-08-04 LAB — CBG MONITORING, ED: Glucose-Capillary: 106 mg/dL — ABNORMAL HIGH (ref 70–99)

## 2021-08-04 LAB — LIPASE, BLOOD: Lipase: 30 U/L (ref 11–51)

## 2021-08-04 MED ORDER — PROCHLORPERAZINE 25 MG RE SUPP: 25.0000 mg | Freq: Two times a day (BID) | RECTAL | 0 refills | Status: DC | PRN

## 2021-08-04 MED ORDER — PROCHLORPERAZINE EDISYLATE 10 MG/2ML IJ SOLN
10.0000 mg | Freq: Once | INTRAMUSCULAR | Status: AC
  Administered 2021-08-04: 10 mg via INTRAVENOUS
  Filled 2021-08-04: qty 2

## 2021-08-04 MED ORDER — ONDANSETRON HCL 4 MG/2ML IJ SOLN
4.0000 mg | Freq: Once | INTRAMUSCULAR | Status: AC
  Administered 2021-08-04: 4 mg via INTRAVENOUS
  Filled 2021-08-04: qty 2

## 2021-08-04 MED ORDER — DICYCLOMINE HCL 20 MG PO TABS: 20.0000 mg | ORAL_TABLET | Freq: Three times a day (TID) | ORAL | 0 refills | Status: DC | PRN

## 2021-08-04 MED ORDER — ONDANSETRON 4 MG PO TBDP: 4.0000 mg | ORAL_TABLET | Freq: Three times a day (TID) | ORAL | 0 refills | Status: DC | PRN

## 2021-08-04 MED ORDER — DIPHENHYDRAMINE HCL 50 MG/ML IJ SOLN
25.0000 mg | Freq: Once | INTRAMUSCULAR | Status: AC
  Administered 2021-08-04: 25 mg via INTRAVENOUS
  Filled 2021-08-04: qty 1

## 2021-08-04 MED ORDER — LACTATED RINGERS IV BOLUS
1000.0000 mL | Freq: Once | INTRAVENOUS | Status: AC
  Administered 2021-08-04: 1000 mL via INTRAVENOUS

## 2021-08-04 MED ORDER — DICYCLOMINE HCL 10 MG PO CAPS
20.0000 mg | ORAL_CAPSULE | Freq: Once | ORAL | Status: AC
  Administered 2021-08-04: 20 mg via ORAL
  Filled 2021-08-04: qty 2

## 2021-08-04 NOTE — ED Triage Notes (Signed)
First Nurse Note:  Arrives with friend who states patient took 4 doses of Victoza yesterday at 1600.  Patient is AAOx3.  Skin warm and dry. NAD

## 2021-10-20 ENCOUNTER — Ambulatory Visit: Payer: Medicare HMO

## 2021-11-06 ENCOUNTER — Ambulatory Visit (LOCAL_COMMUNITY_HEALTH_CENTER): Payer: Medicare HMO | Admitting: Nurse Practitioner

## 2021-11-06 VITALS — BP 127/89 | HR 84 | Ht 65.0 in | Wt 279.6 lb

## 2021-11-06 DIAGNOSIS — Z3009 Encounter for other general counseling and advice on contraception: Secondary | ICD-10-CM | POA: Diagnosis not present

## 2021-11-06 DIAGNOSIS — Z01419 Encounter for gynecological examination (general) (routine) without abnormal findings: Secondary | ICD-10-CM | POA: Diagnosis not present

## 2021-11-06 DIAGNOSIS — Z3202 Encounter for pregnancy test, result negative: Secondary | ICD-10-CM | POA: Diagnosis not present

## 2021-11-06 DIAGNOSIS — Z113 Encounter for screening for infections with a predominantly sexual mode of transmission: Secondary | ICD-10-CM

## 2021-11-06 LAB — HM HIV SCREENING LAB: HM HIV Screening: NEGATIVE

## 2021-11-06 NOTE — Progress Notes (Signed)
Pt visit for PE, Pap, STI screening, and pregnancy test. Pt declined to discuss birthcontrol options today. Female condoms given. Family planning packet given and contents reviewed. Initial STI results discussed with pt. Pt seen by FNP White.
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
## 2021-11-07 LAB — HEMOGLOBIN A1C
Est. average glucose Bld gHb Est-mCnc: 114 mg/dL
Hgb A1c MFr Bld: 5.6 % (ref 4.8–5.6)

## 2021-11-07 LAB — SPECIMEN STATUS REPORT

## 2021-11-09 LAB — PREGNANCY, URINE: Preg Test, Ur: NEGATIVE

## 2021-11-09 LAB — WET PREP FOR TRICH, YEAST, CLUE
Trichomonas Exam: NEGATIVE
Yeast Exam: NEGATIVE

## 2021-11-16 ENCOUNTER — Other Ambulatory Visit: Payer: Self-pay

## 2021-11-16 ENCOUNTER — Emergency Department
Admission: EM | Admit: 2021-11-16 | Discharge: 2021-11-16 | Disposition: A | Discharge status: Home / Self Care | Payer: Medicare HMO | Attending: Emergency Medicine | Admitting: Emergency Medicine

## 2021-11-16 DIAGNOSIS — H9201 Otalgia, right ear: Secondary | ICD-10-CM | POA: Diagnosis not present

## 2021-11-16 DIAGNOSIS — J029 Acute pharyngitis, unspecified: Secondary | ICD-10-CM | POA: Diagnosis present

## 2021-11-16 DIAGNOSIS — B349 Viral infection, unspecified: Secondary | ICD-10-CM

## 2021-11-16 LAB — GROUP A STREP BY PCR: Group A Strep by PCR: NOT DETECTED

## 2021-11-16 MED ORDER — KETOROLAC TROMETHAMINE 30 MG/ML IJ SOLN
30.0000 mg | Freq: Once | INTRAMUSCULAR | Status: AC
  Administered 2021-11-16: 30 mg via INTRAMUSCULAR
  Filled 2021-11-16: qty 1

## 2021-11-16 MED ORDER — ACETAMINOPHEN 500 MG PO TABS
1000.0000 mg | ORAL_TABLET | Freq: Once | ORAL | Status: AC
  Administered 2021-11-16: 1000 mg via ORAL
  Filled 2021-11-16: qty 2

## 2021-11-16 NOTE — ED Provider Notes (Signed)
[facility] Provider Note    Event Date/Time   First MD Initiated Contact with Patient 11/16/21 [phone]     (approximate)   History   Otalgia   HPI  Jacqueline Osborn is a 36 y.o. female who presents to the ED for evaluation of Otalgia   Patient presents to the ED for evaluation of right ear pain.  Also reports a sore throat.  No fevers, shortness of breath or difficulty swallowing, though does report some mild odynophagia.    Physical Exam   Triage Vital Signs: ED Triage Vitals  Enc Vitals Group     BP 11/16/21 0035 (!) 144/102     Pulse Rate 11/16/21 0035 77     Resp 11/16/21 0035 20     Temp 11/16/21 0035 98 F (36.7 C)     Temp Source 11/16/21 0035 Oral     SpO2 11/16/21 0035 97 %     Weight 11/16/21 0028 279 lb (126.6 kg)     Height --      Head Circumference --      Peak Flow --      Pain Score 11/16/21 0028 10     Pain Loc --      Pain Edu? --      Excl. in [place]? --     Most recent vital signs: Vitals:   11/16/21 0035  BP: (!) 144/102  Pulse: 77  Resp: 20  Temp: 98 F (36.7 C)  SpO2: 97%    General: Awake, no distress.  CV:  Good peripheral perfusion.  Resp:  Normal effort.  Abd:  No distention.  MSK:  No deformity noted.  Neuro:  No focal deficits appreciated. Other:   Right tonsil is swollen, 3+ and with exudate.  Uvula is midline and no signs of PTA or upper airway obstruction. TMs are clear bilaterally No signs of mastoiditis or external auditory canal or pinna pathology.  ED Results / Procedures / Treatments   Labs (all labs ordered are listed, but only abnormal results are displayed) Labs Reviewed  GROUP A STREP BY PCR    EKG   RADIOLOGY   Official radiology report(s): No results found.  PROCEDURES and INTERVENTIONS:  Procedures  Medications  ketorolac (TORADOL) 30 MG/ML injection 30 mg (30 mg Intramuscular Given 11/16/21 0344)  acetaminophen (TYLENOL) tablet 1,000 mg (1,000 mg Oral Given 11/16/21  0344)     IMPRESSION / MDM / ASSESSMENT AND PLAN / ED COURSE  I reviewed the triage vital signs and the nursing notes.  Differential diagnosis includes, but is not limited to, viral syndrome, strep throat, AOM, otitis media, mastoiditis, PTA  {Patient presents with symptoms of an acute illness or injury that is potentially life-threatening.  36 year old woman presents with an earache and sore throat.  We will send a strep swab due to her enlarged tonsils and to evaluate for need for antibiotics.  Her ears actually look okay without signs of cerumen impaction or AOM.  She looks systemically well overall.  She will be suitable for outpatient management, plus or minus antibiotics based off this strep swab.  Patient reports improved pain after Toradol and Tylenol.  We discussed reassuring strep swab and likely viral syndrome.  Answered questions.  Discussed management at home and return precautions.     FINAL CLINICAL IMPRESSION(S) / ED DIAGNOSES   Final diagnoses:  Right ear pain  Acute viral syndrome  Viral pharyngitis     Rx / DC Orders  ED Discharge Orders     None        Note:  This document was prepared using Dragon voice recognition software and may include unintentional dictation errors.   Delton Prairie, MD 11/16/21 [phone]

## 2021-11-16 NOTE — Discharge Instructions (Addendum)
Please take Tylenol and ibuprofen/Advil for your pain.  It is safe to take them together, or to alternate them every few hours.  Take up to 1000mg of Tylenol at a time, up to 4 times per day.  Do not take more than 4000 mg of Tylenol in 24 hours.  For ibuprofen, take 400-600 mg, 3 - 4 times per day.  

## 2021-11-16 NOTE — ED Triage Notes (Signed)
Pt arrives with c/o left ear pain that started yesterday. Pt denies fevers.

## 2021-11-17 LAB — IGP, APTIMA HPV
HPV Aptima: POSITIVE — AB
PAP Smear Comment: 0

## 2021-11-18 ENCOUNTER — Telehealth: Payer: Self-pay

## 2021-11-18 NOTE — Telephone Encounter (Addendum)
Jacqueline Cromer, FNP  11/17/2021  3:36 PM EDT Back to Top    ASC-US, HPV positive.  Refer for Colpo.    11/06/21 PAP (pap card mailed 11/18/21)

## 2021-11-20 NOTE — Telephone Encounter (Signed)
Phone call to pt and pt confirmed password.  Discussed pap result and colpo referral. Pt states she prefers to go someplace local and she does have insurance/medicaid.  Pt wants [facility].  Pt counseled that referral will be sent to [facility] and they would contact her to set up appt, and expect lead times for colpo to be extended. Pt expressed understanding.  Colpo referral completed and sent to [facility] through Epic referral process on 11/20/21.

## 2021-12-15 ENCOUNTER — Other Ambulatory Visit (HOSPITAL_COMMUNITY)
Admission: RE | Admit: 2021-12-15 | Discharge: 2021-12-15 | Disposition: A | Discharge status: Home / Self Care | Payer: Medicare HMO | Source: Ambulatory Visit | Attending: Obstetrics and Gynecology | Admitting: Obstetrics and Gynecology

## 2021-12-15 ENCOUNTER — Encounter: Payer: Self-pay | Admitting: Obstetrics and Gynecology

## 2021-12-15 ENCOUNTER — Ambulatory Visit (INDEPENDENT_AMBULATORY_CARE_PROVIDER_SITE_OTHER): Payer: Medicare HMO | Admitting: Obstetrics and Gynecology

## 2021-12-15 VITALS — BP 145/90 | HR 80 | Ht 65.0 in | Wt 282.0 lb

## 2021-12-15 DIAGNOSIS — R87610 Atypical squamous cells of undetermined significance on cytologic smear of cervix (ASC-US): Secondary | ICD-10-CM | POA: Diagnosis present

## 2021-12-15 DIAGNOSIS — N870 Mild cervical dysplasia: Secondary | ICD-10-CM

## 2021-12-15 DIAGNOSIS — R87810 Cervical high risk human papillomavirus (HPV) DNA test positive: Secondary | ICD-10-CM | POA: Diagnosis present

## 2021-12-15 NOTE — Addendum Note (Signed)
Addended by: Chilton Greathouse on: 12/15/2021 12:04 PM   Modules accepted: Orders

## 2021-12-15 NOTE — Progress Notes (Signed)
     GYNECOLOGY OFFICE COLPOSCOPY PROCEDURE NOTE  36 y.o. Jacqueline Osborn here for colposcopy for  ASC-US, HPV positive.  pap smear on 11/06/2021. Discussed role for HPV in cervical dysplasia, need for surveillance.  Patient gave informed written consent, time out was performed.  Placed in lithotomy position. Cervix viewed with speculum and colposcope after application of acetic acid.   Colposcopy adequate? Yes  no mosaicism, no punctation, no abnormal vasculature, and faint acetowhite lesion(s) noted at 7 o'clock; corresponding biopsies obtained.  ECC specimen obtained. All specimens were labeled and sent to pathology.  Chaperone was present during entire procedure.  Patient was given post procedure instructions.  Will follow up pathology and manage accordingly; patient will be contacted with results and recommendations.  Routine preventative health maintenance measures emphasized.    Rubie Maid, MD [place]

## 2021-12-16 ENCOUNTER — Encounter: Payer: Self-pay | Admitting: Obstetrics and Gynecology

## 2021-12-16 LAB — SURGICAL PATHOLOGY

## 2022-02-21 IMAGING — CR DG CHEST 2V
2 series · 2 of 2 positions shown · non-contrast
Comparison: None.

CLINICAL DATA: Asthma

EXAM:
CHEST - 2 VIEW

[chest pa]
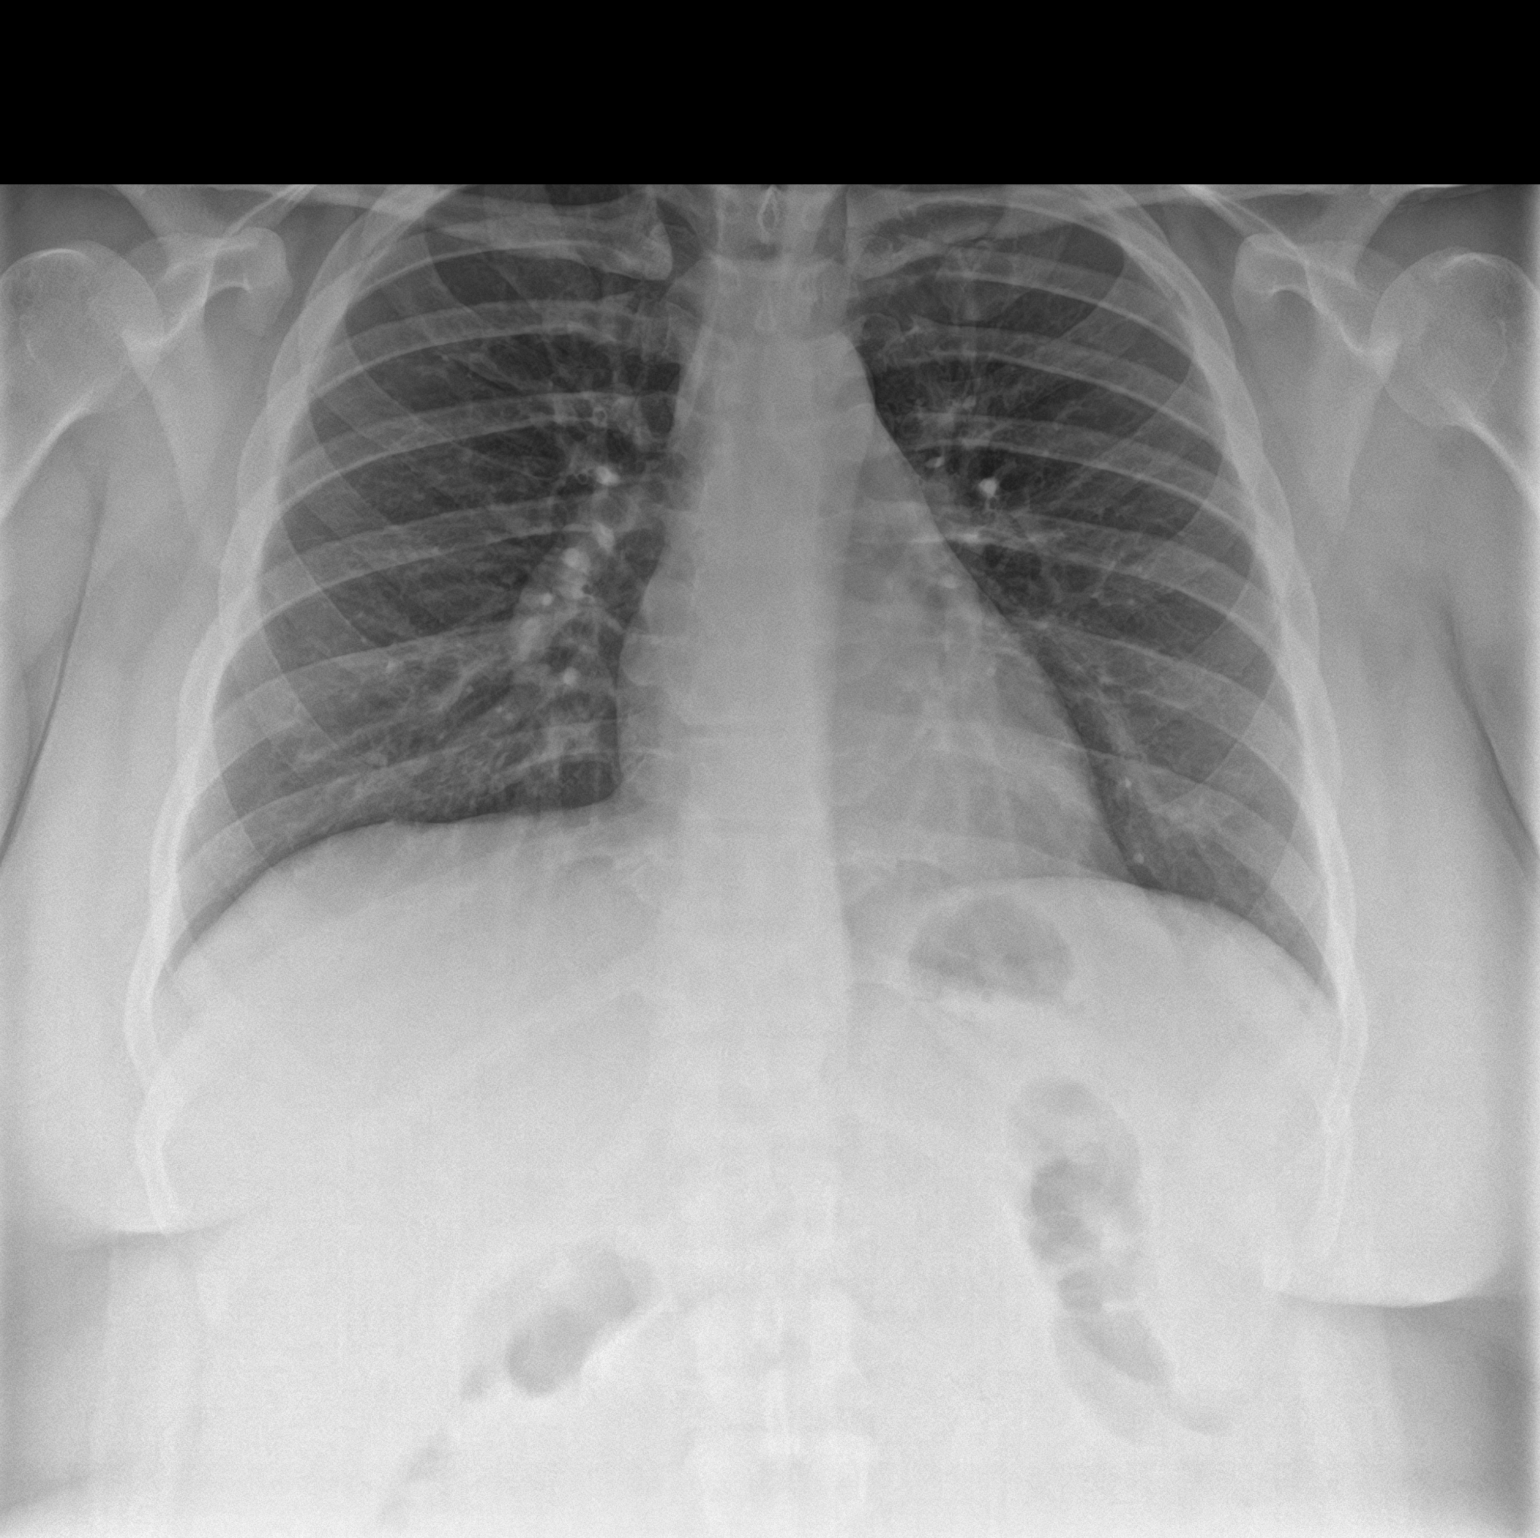

[chest lat]
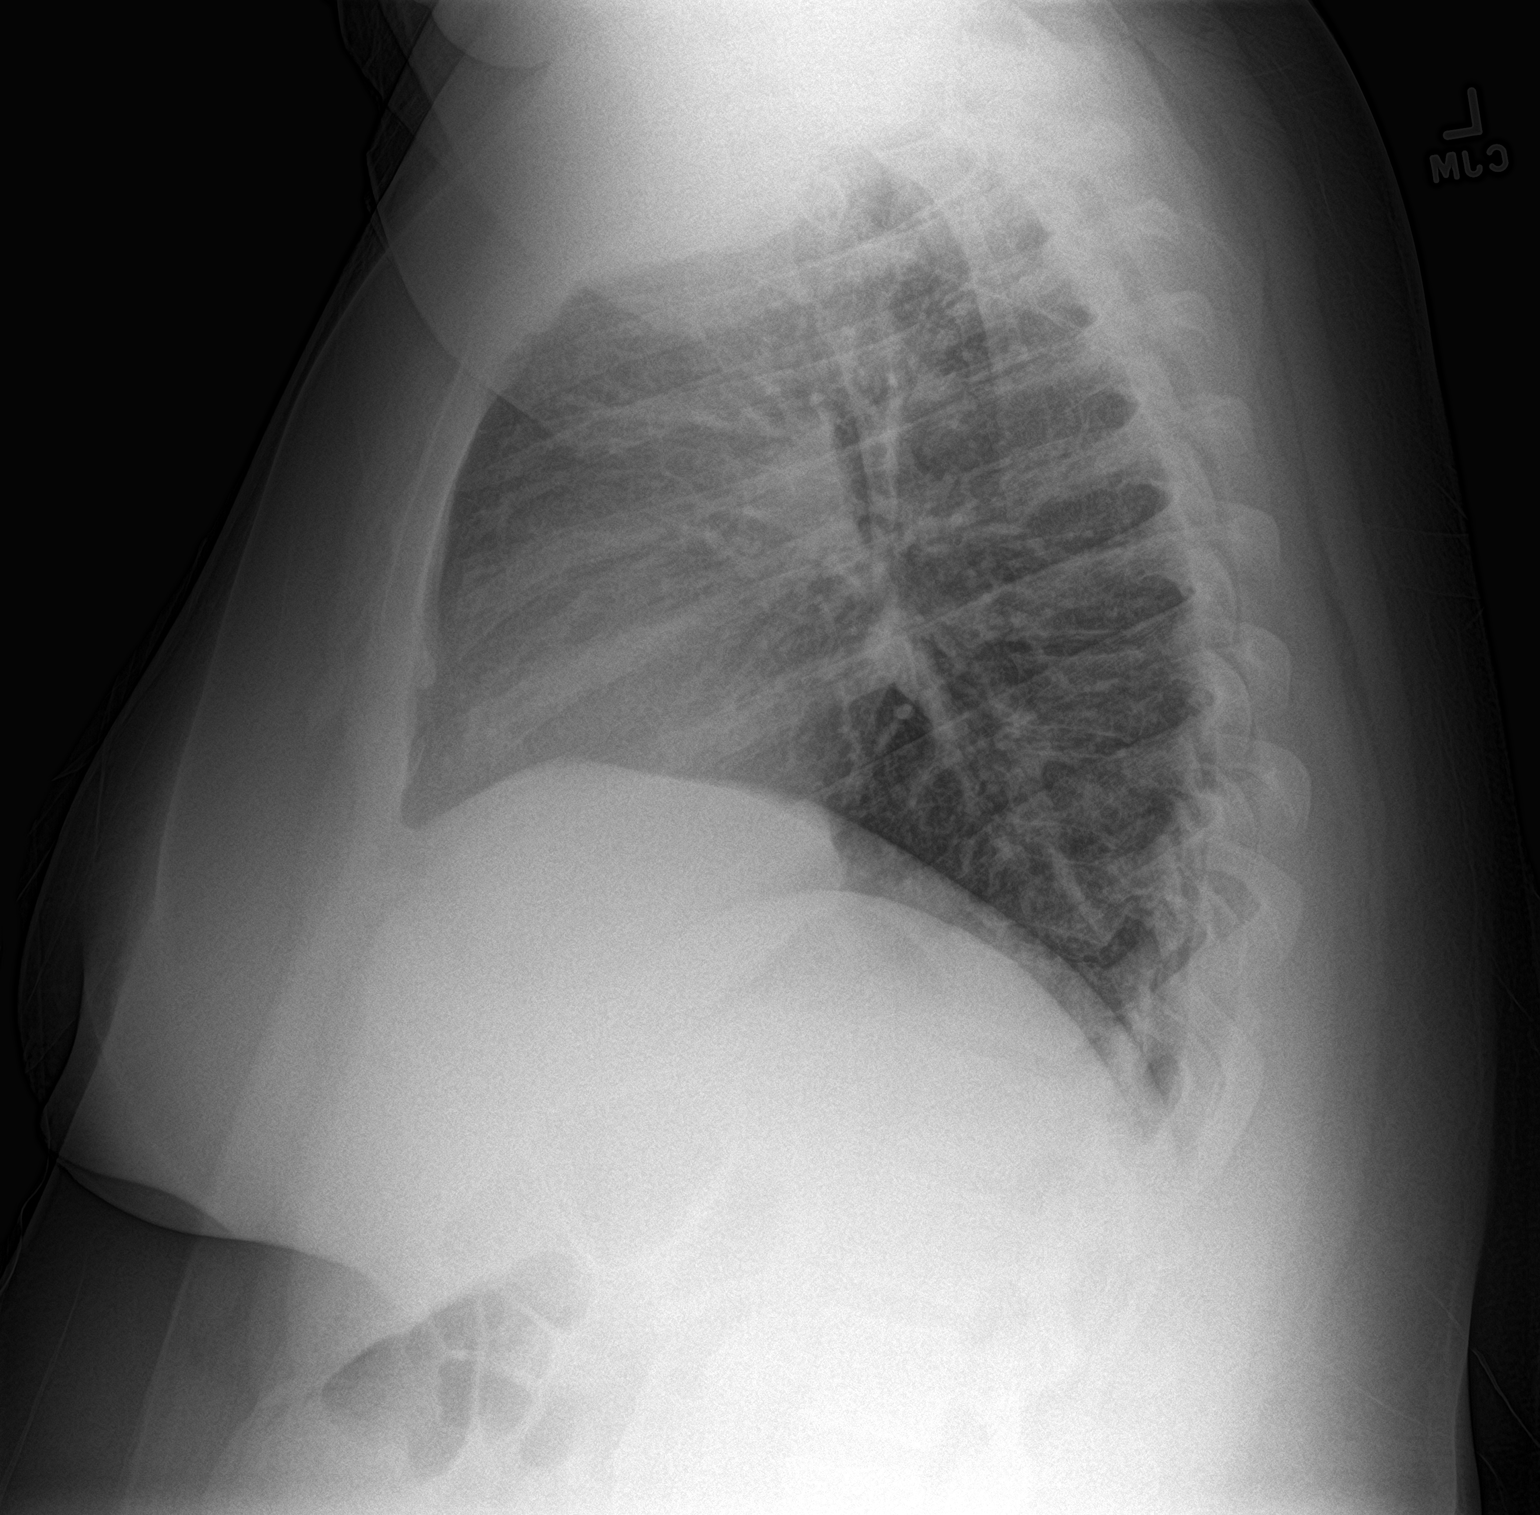

[2 of 2 positions shown; findings below may reference images not displayed]

FINDINGS: The heart size and mediastinal contours are within normal limits. No
focal airspace consolidation, pleural effusion, or pneumothorax. The
visualized skeletal structures are unremarkable.
IMPRESSION: No active cardiopulmonary disease.

## 2022-10-15 ENCOUNTER — Ambulatory Visit: Payer: Medicare HMO

## 2022-11-03 LAB — HM HIV SCREENING LAB: HM HIV Screening: NEGATIVE

## 2022-11-04 ENCOUNTER — Ambulatory Visit: Payer: 59 | Admitting: Physician Assistant

## 2022-11-04 ENCOUNTER — Encounter: Payer: Self-pay | Admitting: Physician Assistant

## 2022-11-04 DIAGNOSIS — Z113 Encounter for screening for infections with a predominantly sexual mode of transmission: Secondary | ICD-10-CM

## 2022-11-04 NOTE — Progress Notes (Signed)
Wet prep reviewed - negative results and A. Streilein PA-C notified. Jossie Ng, RN
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
## 2022-11-05 LAB — WET PREP FOR TRICH, YEAST, CLUE
Trichomonas Exam: NEGATIVE
Yeast Exam: NEGATIVE

## 2022-12-21 ENCOUNTER — Ambulatory Visit: Payer: 59 | Admitting: Psychiatry

## 2022-12-21 ENCOUNTER — Encounter: Payer: Self-pay | Admitting: Psychiatry

## 2022-12-21 VITALS — BP 153/99 | HR 72 | Temp 97.2°F | Ht 65.0 in | Wt 290.8 lb

## 2022-12-21 DIAGNOSIS — F331 Major depressive disorder, recurrent, moderate: Secondary | ICD-10-CM

## 2022-12-21 DIAGNOSIS — Z79899 Other long term (current) drug therapy: Secondary | ICD-10-CM

## 2022-12-21 DIAGNOSIS — F4310 Post-traumatic stress disorder, unspecified: Secondary | ICD-10-CM | POA: Diagnosis not present

## 2022-12-21 NOTE — Patient Instructions (Addendum)
PLEASE CALL FOR THERAPY  [facility] [address], [place], [place] [address] [email or web address]  [phone]      [email or web address]  [email or web address]  [email or web address] [facility] [address], [place], [place] [address]   [phone]  [facility] [email or web address] [address] [place], [place] [address]  [phone]  [facility], [facility] [email or web address] [address] [place], [place] [address]  [phone]   [facility] - [phone]  [facility] - [phone]  [facility] - [phone] counseling [phone]  [facility] [phone]   [email or web address] this clinician can offer telehealth and has a sliding scale option  [email or web address] this group also offers sliding scale rates and is based out of   Dr. Liborio Nixon with the [facility] specializes in divorce  [facility] [employer] has interns who offer sliding scale rates and some of the full time clinicians do, as well. You complete their contact form on their website and the referrals coordinator will help to get connected to someone   Medicaid below :  [facility] Tra[facility] Addiction Counsel[facility] [address] [place], [place] [address]  [phone]    [employer] [facility] [address] [place], [place] [address]  [phone]    [facility] [address] [place], [place] [address]  [phone]
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
## 2023-01-27 ENCOUNTER — Ambulatory Visit: Payer: 59 | Admitting: Psychiatry

## 2023-01-27 ENCOUNTER — Encounter: Payer: Self-pay | Admitting: Psychiatry

## 2023-01-27 VITALS — BP 139/96 | HR 80 | Temp 97.6°F | Ht 65.0 in | Wt 292.2 lb

## 2023-01-27 DIAGNOSIS — F331 Major depressive disorder, recurrent, moderate: Secondary | ICD-10-CM

## 2023-01-27 DIAGNOSIS — F4310 Post-traumatic stress disorder, unspecified: Secondary | ICD-10-CM | POA: Diagnosis not present

## 2023-01-27 DIAGNOSIS — F4000 Agoraphobia, unspecified: Secondary | ICD-10-CM

## 2023-01-27 MED ORDER — SERTRALINE HCL 100 MG PO TABS: 150.0000 mg | ORAL_TABLET | Freq: Every day | ORAL | 1 refills | Status: DC

## 2023-01-27 NOTE — Progress Notes (Signed)
BH MD OP Progress Note  01/27/2023 12:05 PM Jacqueline Osborn  MRN:  [id number]  Chief Complaint:  Chief Complaint  Patient presents with   Follow-up   Depression   Anxiety   Post-Traumatic Stress Disorder   Medication Refill   HPI: Jacqueline Osborn is a 37 year old female, biracial, currently single, on SSI, lives in [place], has a history of PTSD, MDD, learning disability, obesity, asthma was evaluated in office today.  The patient presents with a primary complaint of physical discomfort and fatigue, which she attributes to her weight. The patient expresses dissatisfaction with her physical health, citing body aches, particularly in the feet and knees, and shortness of breath. She also reports a significant impact on her mobility, with an incident where her knee locked while walking.  The patient's weight has been a longstanding concern, and she is currently exploring options for weight management, including a referral for a gastric bypass consultation at [facility]. She has previously tried an injectable medication for weight loss but experienced an overdose due to misunderstanding the dosage instructions.  The patient's mental health symptoms persist, with reports of anxiety and depression. She describes a racing mind at night, which is somewhat alleviated by her medication.  The patient's social history reveals a preference for isolation due to self-consciousness about her weight and difficulty with [place] language fluency. She expresses a desire to be more active but feels hindered by her physical discomfort and societal judgment. The patient has two children and lives near her mother, who provides support.  Patient reports she stays to herself all the time in her bedroom and does not interact with anyone most of the time.  The patient's sleep is reportedly good due to the sedative effects of her medication. However, she spends a significant amount of time in bed due to physical discomfort and  a preference for solitude.   Patient denies any suicidality, homicidality or perceptual disturbances.   Visit Diagnosis:    ICD-10-CM   1. PTSD (post-traumatic stress disorder)  F43.10 sertraline (ZOLOFT) 100 MG tablet    2. MDD (major depressive disorder), recurrent episode, moderate (HCC)  F33.1 sertraline (ZOLOFT) 100 MG tablet    3. 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  Patient reports 1 suicide attempt several years ago.  Past Medical History:  Past Medical History:  Diagnosis Date   Asthma    Hx of migraines 02/01/2018   self dx   Mental disorder    depression and bipolar as child; tx'd at [facility] ~2 yrs ago.   Vaginal Pap smear, abnormal    04/14/11 LSIL with cells suspicious for HSIL, subsequent colpo with negative bx    Past Surgical History:  Procedure Laterality Date   denies      Family Psychiatric History: I have reviewed family psychiatric history from progress note on 12/21/2022.  Family History:  Family History  Problem Relation Age of Onset   Endocrine Disorder Mother    Thyroid disease Mother    Anxiety disorder Sister    Endocrine Disorder Sister    Thyroid disease Sister    Cancer Maternal Grandfather        liver   Diabetes Paternal Grandfather    Breast cancer Other     Social History: I have reviewed social history from progress note on 12/21/2022. Social History   Socioeconomic History   Marital status: Single    Spouse  name: Jacqueline Osborn   Number of children: 2   Years of education: Not on file   Highest education level: 8th grade  Occupational History   Occupation: SSI  Tobacco Use   Smoking status: Never   Smokeless tobacco: Never   Tobacco comments:    Tried vaping x1 only per client (11/04/22).  Vaping Use   Vaping status: Never Used  Substance and Sexual Activity   Alcohol use: Yes    Comment: occasional. Doesn't like it.   Drug use:  Never   Sexual activity: Yes    Partners: Male    Birth control/protection: Condom  Other Topics Concern   Not on file  Social History Narrative   Not on file   Social Drivers of Health   Financial Resource Strain: Not on file  Food Insecurity: Not on file  Transportation Needs: Not on file  Physical Activity: Not on file  Stress: Not on file  Social Connections: Not on file    Allergies: No Known Allergies  Metabolic Disorder Labs: Lab Results  Component Value Date   HGBA1C 5.6 11/06/2021   No results found for: "PROLACTIN" No results found for: "CHOL", "TRIG", "HDL", "CHOLHDL", "VLDL", "LDLCALC" No results found for: "TSH"  Therapeutic Level Labs: No results found for: "LITHIUM" No results found for: "VALPROATE" No results found for: "CBMZ"  Current Medications: Current Outpatient Medications  Medication Sig Dispense Refill   albuterol (VENTOLIN HFA) 108 (90 Base) MCG/ACT inhaler Inhale 2 puffs into the lungs every 6 (six) hours as needed for shortness of breath. 6.7 g 0   hydrOXYzine (ATARAX) 25 MG tablet Take by mouth.     naproxen sodium (ANAPROX) 550 MG tablet Take 550 mg by mouth 2 (two) times daily.     omeprazole (PRILOSEC) 20 MG capsule Take 20 mg by mouth daily.     ondansetron (ZOFRAN-ODT) 4 MG disintegrating tablet Take 1 tablet (4 mg total) by mouth every 8 (eight) hours as needed for nausea or vomiting. 20 tablet 0   prochlorperazine (COMPAZINE) 25 MG suppository Place 1 suppository (25 mg total) rectally every 12 (twelve) hours as needed for refractory nausea / vomiting. 12 suppository 0   QVAR REDIHALER 80 MCG/ACT inhaler Inhale into the lungs.     VICTOZA 18 MG/3ML SOPN Inject into the skin.     ipratropium-albuterol (DUONEB) 0.5-2.5 (3) MG/3ML SOLN Take 3 mLs by nebulization every 2 (two) hours as needed for up to 20 days. 360 mL 1   sertraline (ZOLOFT) 100 MG tablet Take 1.5 tablets (150 mg total) by mouth daily. 45 tablet 1   No current  facility-administered medications for this visit.     Musculoskeletal: Strength & Muscle Tone: within normal limits Gait & Station: normal Patient leans: N/A  Psychiatric Specialty Exam: Review of Systems  Psychiatric/Behavioral:  Positive for dysphoric mood. The patient is nervous/anxious.     Blood pressure (!) 139/96, pulse 80, temperature 97.6 F (36.4 C), temperature source Skin, height 5\' 5"  (1.651 m), weight 292 lb 3.2 oz (132.5 kg).Body mass index is 48.62 kg/m.  General Appearance: Casual  Eye Contact:  Good  Speech:  Clear and Coherent  Volume:  Normal  Mood:  Anxious and Depressed  Affect:  Tearful  Thought Process:  Goal Directed and Descriptions of Associations: Intact  Orientation:  Full (Time, Place, and Person)  Thought Content: Rumination   Suicidal Thoughts:  No  Homicidal Thoughts:  No  Memory:  Immediate;   Fair Recent;  Fair Remote;   Poor  Judgement:  Fair  Insight:  Shallow  Psychomotor Activity:  Normal  Concentration:  Concentration: Fair and Attention Span: Fair  Recall:   Limited  Fund of Knowledge: Fair  Language: Fair  Akathisia:  No  Handed:  Right  AIMS (if indicated): not done  Assets:  Desire for Improvement Housing Social Support  ADL's:  Intact  Cognition: WNL  Sleep:  Fair   Screenings: GAD-7    Flowsheet Row Office Visit from 01/27/2023 in [facility] Office Visit from 12/21/2022 in [facility]  Total GAD-7 Score 20 20      PHQ2-9    Flowsheet Row Office Visit from 01/27/2023 in [facility] Office Visit from 12/21/2022 in [facility] Office Visit from 11/06/2021 in [facility] Postpartum Visit from 11/02/2018 in [facility]  PHQ-2 Total Score 4 4 2  0  PHQ-9 Total Score 18 17 -- --      Flowsheet Row Office Visit  from 01/27/2023 in [facility] Office Visit from 12/21/2022 in [facility] ED from 11/16/2021 in [facility] at [facility]  C-SSRS RISK CATEGORY Moderate Risk Moderate Risk No Risk        Assessment and Plan: Jacqueline Osborn is a 37 year old biracial female, on SSI, has a history of learning disability, depression, anxiety, asthma was evaluated in office today.  Patient with continued mood symptoms, agoraphobia, PTSD symptoms, discussed assessment and plan as noted below.  Major Depressive Disorder-unstable Ongoing symptoms of major depression, including fatigue, lack of motivation, and social withdrawal. Current medication, sertraline 100 mg, aids sleep but significant anxiety and depressive symptoms persist. No recent suicidal ideation or attempts; last attempt six months ago. Open to increasing sertraline to 150 mg daily. Discussed potential benefits (improved mood and anxiety control) and risks (increased drowsiness, gastrointestinal issues). - Increase sertraline to 150 mg daily - Schedule therapy session with in-house therapist - Send new prescription to [facility]  in [place]  Posttraumatic Stress Disorder (PTSD)-unstable Symptoms include irritability, anger, intrusive memories, nightmares, and flashbacks. Medication helps with sleep and reduces time spent on traumatic thoughts. Therapy not yet started but appointment scheduled with [facility] in January. Discussed therapy benefits for managing PTSD symptoms and improving quality of life. - Schedule therapy session with in-house therapist - Increase sertraline to 150 mg p.o. daily - Hydroxyzine 25 mg as needed for severe anxiety. - Maintain appointment with [facility] if it occurs sooner  Agoraphobia-unstable Patient rarely leaves her home and isolates herself. - Patient to establish care with therapist.  Patient will  benefit from CBT.  Obesity Significant physical discomfort due to obesity, including knee and ankle pain, and mobility issues. Referral for gastric bypass consultation at [facility] on December 31. Previous weight management attempts with medication led to overdose due to dosage misunderstanding. Discussed risks and benefits of gastric bypass (significant weight loss, improved mobility, surgical complications, lifelong dietary changes). - Attend  consultation at [facility] on December 31 - Discuss potential weight loss medications with the specialist at [facility] - Encourage engagement in social activities and physical exercise as tolerated  General Health Maintenance Encouraged to engage in social activities to improve mental health and overall well-being. Concerns about social interactions due to language barriers and social anxiety. Discussed benefits of social engagement and strategies to overcome social anxiety. - Encourage  participation in social activities, such as church or community events - Provide emotional support and reassurance about social interactions - Pending labs-TSH.  Patient noncompliant.  Follow-up - Follow up with therapist after initial session - Attend gastric bypass consultation at [facility] on December 31 - Monitor response to increased sertraline dosage and adjust as needed.  Collaboration of Care: Collaboration of Care: Referral or follow-up with counselor/therapist AEB patient encouraged to establish care with therapist.  Patient/Guardian was advised Release of Information must be obtained prior to any record release in order to collaborate their care with an outside provider. Patient/Guardian was advised if they have not already done so to contact the registration department to sign all necessary forms in order for [place] to release information regarding their care.   Consent: Patient/Guardian gives verbal consent for treatment and assignment of benefits for services provided during this  visit. Patient/Guardian expressed understanding and agreed to proceed.   This note was generated in part or whole with voice recognition software. Voice recognition is usually quite accurate but there are transcription errors that can and very often do occur. I apologize for any typographical errors that were not detected and corrected.    Jomarie Longs, MD 01/28/2023, 7:53 AM

## 2023-02-15 ENCOUNTER — Ambulatory Visit: Payer: 59 | Admitting: Psychiatry

## 2023-03-02 ENCOUNTER — Ambulatory Visit: Payer: 59 | Admitting: Professional Counselor

## 2023-03-07 ENCOUNTER — Telehealth: Payer: Self-pay | Admitting: Psychiatry

## 2023-03-07 NOTE — Telephone Encounter (Signed)
noted

## 2023-03-07 NOTE — Telephone Encounter (Signed)
Patient called requesting to cancel all appointments. Stating she is looking for another provider at a different facility. She did not feel that she connected well with current provider.

## 2023-03-16 ENCOUNTER — Ambulatory Visit: Payer: 59 | Admitting: Professional Counselor

## 2023-04-05 ENCOUNTER — Ambulatory Visit: Payer: 59 | Admitting: Psychiatry

## 2023-10-13 ENCOUNTER — Other Ambulatory Visit: Payer: Self-pay

## 2023-10-13 ENCOUNTER — Ambulatory Visit

## 2023-10-13 VITALS — Ht 65.0 in | Wt 281.0 lb

## 2023-10-13 DIAGNOSIS — Z3009 Encounter for other general counseling and advice on contraception: Secondary | ICD-10-CM | POA: Diagnosis not present

## 2023-10-13 DIAGNOSIS — Z01419 Encounter for gynecological examination (general) (routine) without abnormal findings: Secondary | ICD-10-CM

## 2023-10-13 DIAGNOSIS — Z113 Encounter for screening for infections with a predominantly sexual mode of transmission: Secondary | ICD-10-CM

## 2023-10-13 DIAGNOSIS — N6315 Unspecified lump in the right breast, overlapping quadrants: Secondary | ICD-10-CM

## 2023-10-13 DIAGNOSIS — Z124 Encounter for screening for malignant neoplasm of cervix: Secondary | ICD-10-CM

## 2023-10-13 LAB — HM HIV SCREENING LAB: HM HIV Screening: NEGATIVE

## 2023-10-13 LAB — WET PREP FOR TRICH, YEAST, CLUE
Clue Cell Exam: NEGATIVE
Trichomonas Exam: NEGATIVE
Yeast Exam: NEGATIVE

## 2023-10-13 NOTE — Progress Notes (Signed)
 Pt is here for family planning visit.  Family planning education card reviewed and given to pt.  Wet prep results reviewed, no treatment required per standing orders.Larraine JONELLE Northern, RN
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
## 2023-10-16 LAB — IGP, APTIMA HPV
HPV Aptima: NEGATIVE
PAP Smear Comment: 0

## 2023-10-17 ENCOUNTER — Ambulatory Visit: Payer: Self-pay

## 2023-10-17 NOTE — Progress Notes (Signed)
 Normal pap with negative HPV. This patient needs one year follow up due to having ASCUS with positive HPV last year.

## 2023-10-20 ENCOUNTER — Emergency Department

## 2023-10-20 ENCOUNTER — Ambulatory Visit
Admission: RE | Admit: 2023-10-20 | Discharge: 2023-10-20 | Disposition: A | Discharge status: Home / Self Care | Source: Ambulatory Visit

## 2023-10-20 ENCOUNTER — Encounter: Payer: Self-pay | Admitting: Radiology

## 2023-10-20 ENCOUNTER — Ambulatory Visit: Payer: Self-pay

## 2023-10-20 ENCOUNTER — Emergency Department
Admission: EM | Admit: 2023-10-20 | Discharge: 2023-10-20 | Disposition: A | Discharge status: Home / Self Care | Attending: Emergency Medicine | Admitting: Emergency Medicine

## 2023-10-20 ENCOUNTER — Other Ambulatory Visit: Payer: Self-pay

## 2023-10-20 DIAGNOSIS — N6315 Unspecified lump in the right breast, overlapping quadrants: Secondary | ICD-10-CM

## 2023-10-20 DIAGNOSIS — R059 Cough, unspecified: Secondary | ICD-10-CM | POA: Diagnosis present

## 2023-10-20 DIAGNOSIS — J45901 Unspecified asthma with (acute) exacerbation: Secondary | ICD-10-CM | POA: Diagnosis not present

## 2023-10-20 DIAGNOSIS — Z7952 Long term (current) use of systemic steroids: Secondary | ICD-10-CM | POA: Diagnosis not present

## 2023-10-20 LAB — CBC WITH DIFFERENTIAL/PLATELET
Abs Immature Granulocytes: 0.02 K/uL (ref 0.00–0.07)
Basophils Absolute: 0 K/uL (ref 0.0–0.1)
Basophils Relative: 1 %
Eosinophils Absolute: 0.3 K/uL (ref 0.0–0.5)
Eosinophils Relative: 5 %
HCT: 41.3 % (ref 36.0–46.0)
Hemoglobin: 13.1 g/dL (ref 12.0–15.0)
Immature Granulocytes: 0 %
Lymphocytes Relative: 33 %
Lymphs Abs: 2 K/uL (ref 0.7–4.0)
MCH: 28.8 pg (ref 26.0–34.0)
MCHC: 31.7 g/dL (ref 30.0–36.0)
MCV: 90.8 fL (ref 80.0–100.0)
Monocytes Absolute: 0.4 K/uL (ref 0.1–1.0)
Monocytes Relative: 7 %
Neutro Abs: 3.3 K/uL (ref 1.7–7.7)
Neutrophils Relative %: 54 %
Platelets: 420 K/uL — ABNORMAL HIGH (ref 150–400)
RBC: 4.55 MIL/uL (ref 3.87–5.11)
RDW: 12.7 % (ref 11.5–15.5)
WBC: 6 K/uL (ref 4.0–10.5)
nRBC: 0 % (ref 0.0–0.2)

## 2023-10-20 LAB — COMPREHENSIVE METABOLIC PANEL WITH GFR
ALT: 22 U/L (ref 0–44)
AST: 18 U/L (ref 15–41)
Albumin: 4.1 g/dL (ref 3.5–5.0)
Alkaline Phosphatase: 75 U/L (ref 38–126)
Anion gap: 7 (ref 5–15)
BUN: 8 mg/dL (ref 6–20)
CO2: 25 mmol/L (ref 22–32)
Calcium: 9.1 mg/dL (ref 8.9–10.3)
Chloride: 108 mmol/L (ref 98–111)
Creatinine, Ser: 0.75 mg/dL (ref 0.44–1.00)
GFR, Estimated: >60 mL/min (ref >60)
Glucose, Bld: 93 mg/dL (ref 70–99)
Potassium: 4.1 mmol/L (ref 3.5–5.1)
Sodium: 140 mmol/L (ref 135–145)
Total Bilirubin: 0.6 mg/dL (ref 0.0–1.2)
Total Protein: 7.7 g/dL (ref 6.5–8.1)

## 2023-10-20 LAB — RESP PANEL BY RT-PCR (RSV, FLU A&B, COVID)  RVPGX2
Influenza A by PCR: NEGATIVE
Influenza B by PCR: NEGATIVE
Resp Syncytial Virus by PCR: NEGATIVE
SARS Coronavirus 2 by RT PCR: NEGATIVE

## 2023-10-20 LAB — POC URINE PREG, ED: Preg Test, Ur: NEGATIVE

## 2023-10-20 MED ORDER — IPRATROPIUM-ALBUTEROL 0.5-2.5 (3) MG/3ML IN SOLN: 3.0000 mL | RESPIRATORY_TRACT | 1 refills | Status: AC | PRN

## 2023-10-20 MED ORDER — PREDNISONE 10 MG (21) PO TBPK: ORAL_TABLET | ORAL | 0 refills | Status: AC

## 2023-10-20 MED ORDER — DEXAMETHASONE SODIUM PHOSPHATE 10 MG/ML IJ SOLN
10.0000 mg | Freq: Once | INTRAMUSCULAR | Status: AC
  Administered 2023-10-20: 10 mg via INTRAMUSCULAR
  Filled 2023-10-20: qty 1

## 2023-10-20 MED ORDER — IPRATROPIUM-ALBUTEROL 0.5-2.5 (3) MG/3ML IN SOLN
6.0000 mL | Freq: Once | RESPIRATORY_TRACT | Status: AC
  Administered 2023-10-20: 6 mL via RESPIRATORY_TRACT
  Filled 2023-10-20: qty 3

## 2023-10-20 NOTE — Progress Notes (Signed)
 Normal mammogram reviewed.  Initiate screening mammograms at age 38. Notified patient.  Damien Satchel NP

## 2023-10-20 NOTE — ED Notes (Signed)
 Patient needed to leave immediately to catch the Medicare bus. No dyspnea noted with ambulation.

## 2023-10-20 NOTE — Discharge Instructions (Signed)
 Please take steroids and antibiotics as directed.  Return here for new or worse symptoms

## 2023-10-20 NOTE — ED Provider Notes (Signed)
 [place] [facility] AT [facility] Provider Note   CSN: [id number] Arrival date & time: 10/20/23  9043     Patient presents with: Cough and Asthma   Jacqueline Osborn is a 38 y.o. female.   Here for cough, wheezing, asthma.  Symptoms for 2 to 3 days.  Notes that she has been using her inhaler and nebulizer without significant improvement.  Has ran out of nebulizer solution.  Denying any fevers or chills.  No chest pain or shortness of breath.   Cough Associated symptoms: no chest pain, no chills and no fever   Asthma Pertinent negatives include no chest pain and no abdominal pain.       Prior to Admission medications   Medication Sig Start Date End Date Taking? Authorizing Provider  predniSONE  (STERAPRED UNI-PAK 21 TAB) 10 MG (21) TBPK tablet Take as directed 10/20/23  Yes Dafne Nield, PA-C  albuterol  (VENTOLIN  HFA) 108 (90 Base) MCG/ACT inhaler Inhale 2 puffs into the lungs every 6 (six) hours as needed for shortness of breath. 09/08/20   Menshew, Candida LULLA Kings, PA-C  ipratropium-albuterol  (DUONEB) 0.5-2.5 (3) MG/3ML SOLN Take 3 mLs by nebulization every 2 (two) hours as needed for up to 20 days. 10/20/23 11/09/23  Kingston Mallick, PA-C  omeprazole (PRILOSEC) 20 MG capsule Take 20 mg by mouth daily. 12/03/22   [provider]  QVAR REDIHALER 80 MCG/ACT inhaler Inhale into the lungs. 12/03/22   [provider]    Allergies: Patient has no known allergies.    Review of Systems  Constitutional:  Negative for chills and fever.  Respiratory:  Positive for cough and choking. Negative for apnea.   Cardiovascular:  Negative for chest pain.  Gastrointestinal:  Negative for abdominal pain.  Neurological:  Negative for weakness and numbness.    Updated Vital Signs BP (!) 158/117 (BP Location: Left Arm)   Pulse 93   Temp 98.3 F (36.8 C) (Oral)   Resp 18   Ht 5' 5 (1.651 m)   Wt 127.5 kg   LMP 10/05/2023 (Exact Date)   SpO2 94%   BMI 46.76  kg/m   Physical Exam Vitals reviewed.  Constitutional:      Appearance: Normal appearance.  HENT:     Head: Normocephalic and atraumatic.     Nose: Nose normal.  Cardiovascular:     Pulses: Normal pulses.  Pulmonary:     Effort: Pulmonary effort is normal. No respiratory distress.     Breath sounds: No stridor. Wheezing present. No rhonchi.  Abdominal:     Tenderness: There is no abdominal tenderness.  Musculoskeletal:     Cervical back: Normal range of motion.  Neurological:     Mental Status: She is alert and oriented to person, place, and time. Mental status is at baseline.  Psychiatric:        Mood and Affect: Mood normal.        Behavior: Behavior normal.     (all labs ordered are listed, but only abnormal results are displayed) Labs Reviewed  CBC WITH DIFFERENTIAL/PLATELET - Abnormal; Notable for the following components:      Result Value   Platelets 420 (*)    All other components within normal limits  RESP PANEL BY RT-PCR (RSV, FLU A&B, COVID)  [id number]  COMPREHENSIVE METABOLIC PANEL WITH GFR  POC URINE PREG, ED    EKG: None  Radiology: DG Chest 2 View Result Date: 10/20/2023 CLINICAL DATA:  Wheezing, productive cough. EXAM: CHEST - 2 VIEW  COMPARISON:  September 08, 2020. FINDINGS: The heart size and mediastinal contours are within normal limits. Both lungs are clear. The visualized skeletal structures are unremarkable. IMPRESSION: No active cardiopulmonary disease. Electronically Signed   By: Lynwood Landy Raddle M.D.   On: 10/20/2023 12:06   MM 3D DIAGNOSTIC MAMMOGRAM BILATERAL BREAST Result Date: 10/20/2023 CLINICAL DATA:  Clinically appreciated palpable area of concern in the RIGHT breast. EXAM: DIGITAL DIAGNOSTIC BILATERAL MAMMOGRAM WITH TOMOSYNTHESIS AND CAD; ULTRASOUND RIGHT BREAST LIMITED TECHNIQUE: Bilateral digital diagnostic mammography and breast tomosynthesis was performed. The images were evaluated with computer-aided detection. ; Targeted ultrasound  examination of the right breast was performed COMPARISON:  None available. ACR Breast Density Category b: There are scattered areas of fibroglandular density. FINDINGS: Diagnostic tomosynthesis views were obtained over the palpable area of concern in the RIGHT lower breast. No suspicious mammographic finding is identified in this area. No suspicious mass, microcalcification, or other finding is identified in either breast. On physical exam, no suspicious mass is appreciated. Targeted RIGHT breast ultrasound was performed in the palpable area of concern at the lower breast. No suspicious solid or cystic mass is identified. IMPRESSION: 1. No mammographic or sonographic evidence of malignancy at the site of palpable concern in the RIGHT lower breast. Any further workup of the patient's symptoms should be based on the clinical assessment. 2. No mammographic evidence of malignancy bilaterally. RECOMMENDATION: Screening mammogram at age 6 unless there are persistent or intervening clinical concerns. (Code:SM-B-40A) I have discussed the findings and recommendations with the patient. If applicable, a reminder letter will be sent to the patient regarding the next appointment. BI-RADS CATEGORY  1: Negative. Electronically Signed   By: Corean Salter M.D.   On: 10/20/2023 09:44   US  LIMITED ULTRASOUND INCLUDING AXILLA RIGHT BREAST Result Date: 10/20/2023 CLINICAL DATA:  Clinically appreciated palpable area of concern in the RIGHT breast. EXAM: DIGITAL DIAGNOSTIC BILATERAL MAMMOGRAM WITH TOMOSYNTHESIS AND CAD; ULTRASOUND RIGHT BREAST LIMITED TECHNIQUE: Bilateral digital diagnostic mammography and breast tomosynthesis was performed. The images were evaluated with computer-aided detection. ; Targeted ultrasound examination of the right breast was performed COMPARISON:  None available. ACR Breast Density Category b: There are scattered areas of fibroglandular density. FINDINGS: Diagnostic tomosynthesis views were obtained  over the palpable area of concern in the RIGHT lower breast. No suspicious mammographic finding is identified in this area. No suspicious mass, microcalcification, or other finding is identified in either breast. On physical exam, no suspicious mass is appreciated. Targeted RIGHT breast ultrasound was performed in the palpable area of concern at the lower breast. No suspicious solid or cystic mass is identified. IMPRESSION: 1. No mammographic or sonographic evidence of malignancy at the site of palpable concern in the RIGHT lower breast. Any further workup of the patient's symptoms should be based on the clinical assessment. 2. No mammographic evidence of malignancy bilaterally. RECOMMENDATION: Screening mammogram at age 70 unless there are persistent or intervening clinical concerns. (Code:SM-B-40A) I have discussed the findings and recommendations with the patient. If applicable, a reminder letter will be sent to the patient regarding the next appointment. BI-RADS CATEGORY  1: Negative. Electronically Signed   By: Corean Salter M.D.   On: 10/20/2023 09:44     Procedures   Medications Ordered in the ED  ipratropium-albuterol  (DUONEB) 0.5-2.5 (3) MG/3ML nebulizer solution 6 mL (6 mLs Nebulization Given 10/20/23 1152)  dexamethasone  (DECADRON ) injection 10 mg (10 mg Intramuscular Given 10/20/23 1149)  Medical Decision Making Patient with history of asthma here for wheezing and cough she is nonhypoxic nontachycardic no desaturation on my exam.  She does have significant wheezing throughout.  Will provide a breathing treatment check labs x-ray and viral swab.  Started on steroids.  Most recent A1c 5.8.  Labs are overall unremarkable.  Chest x-ray is clear.  Patient did have some improvement after breathing treatment and steroids.  Will start on steroid pack.  Refill nebulizer.  Recommend follow-up with primary care.  Given return precautions for new worse or  different symptoms  Amount and/or Complexity of Data Reviewed Labs: ordered. Radiology: ordered.  Risk Prescription drug management.        Final diagnoses:  Exacerbation of asthma, unspecified asthma severity, unspecified whether persistent    ED Discharge Orders          Ordered    ipratropium-albuterol  (DUONEB) 0.5-2.5 (3) MG/3ML SOLN  Every 2 hours PRN        10/20/23 1121    predniSONE  (STERAPRED UNI-PAK 21 TAB) 10 MG (21) TBPK tablet        10/20/23 1247               Kingston Mallick, PA-C 10/20/23 1248    Levander Slate, MD 10/20/23 1302
